# Patient Record
Sex: Male | Born: 1985 | Hispanic: Yes | Marital: Single | Smoking: Current every day smoker
Health system: Southern US, Community
[De-identification: ages and names within clinical notes are randomized; demographics above are authoritative.]

## PROBLEM LIST (undated history)

## (undated) DIAGNOSIS — K449 Diaphragmatic hernia without obstruction or gangrene: Secondary | ICD-10-CM

## (undated) DIAGNOSIS — IMO0002 Reserved for concepts with insufficient information to code with codable children: Secondary | ICD-10-CM

## (undated) DIAGNOSIS — K2970 Gastritis, unspecified, without bleeding: Secondary | ICD-10-CM

## (undated) DIAGNOSIS — G8929 Other chronic pain: Secondary | ICD-10-CM

## (undated) DIAGNOSIS — M549 Dorsalgia, unspecified: Secondary | ICD-10-CM

## (undated) HISTORY — PX: HERNIA REPAIR: SHX51

## (undated) SURGERY — COLONOSCOPY WITH PROPOFOL
Anesthesia: Monitor Anesthesia Care

---

## 2013-01-08 ENCOUNTER — Encounter (HOSPITAL_COMMUNITY): Payer: Self-pay | Admitting: *Deleted

## 2013-01-08 ENCOUNTER — Emergency Department (HOSPITAL_COMMUNITY)
Admission: EM | Admit: 2013-01-08 | Discharge: 2013-01-08 | Disposition: A | Discharge status: Home / Self Care | Payer: Self-pay | Attending: Emergency Medicine | Admitting: Emergency Medicine

## 2013-01-08 DIAGNOSIS — M79641 Pain in right hand: Secondary | ICD-10-CM

## 2013-01-08 DIAGNOSIS — G8911 Acute pain due to trauma: Secondary | ICD-10-CM | POA: Insufficient documentation

## 2013-01-08 DIAGNOSIS — K0889 Other specified disorders of teeth and supporting structures: Secondary | ICD-10-CM

## 2013-01-08 DIAGNOSIS — G8929 Other chronic pain: Secondary | ICD-10-CM | POA: Insufficient documentation

## 2013-01-08 DIAGNOSIS — K089 Disorder of teeth and supporting structures, unspecified: Secondary | ICD-10-CM | POA: Insufficient documentation

## 2013-01-08 DIAGNOSIS — IMO0001 Reserved for inherently not codable concepts without codable children: Secondary | ICD-10-CM | POA: Insufficient documentation

## 2013-01-08 DIAGNOSIS — F17200 Nicotine dependence, unspecified, uncomplicated: Secondary | ICD-10-CM | POA: Insufficient documentation

## 2013-01-08 DIAGNOSIS — Z872 Personal history of diseases of the skin and subcutaneous tissue: Secondary | ICD-10-CM | POA: Insufficient documentation

## 2013-01-08 DIAGNOSIS — Z79899 Other long term (current) drug therapy: Secondary | ICD-10-CM | POA: Insufficient documentation

## 2013-01-08 DIAGNOSIS — Z8719 Personal history of other diseases of the digestive system: Secondary | ICD-10-CM | POA: Insufficient documentation

## 2013-01-08 DIAGNOSIS — IMO0002 Reserved for concepts with insufficient information to code with codable children: Secondary | ICD-10-CM | POA: Insufficient documentation

## 2013-01-08 DIAGNOSIS — M25539 Pain in unspecified wrist: Secondary | ICD-10-CM | POA: Insufficient documentation

## 2013-01-08 HISTORY — DX: Reserved for concepts with insufficient information to code with codable children: IMO0002

## 2013-01-08 HISTORY — DX: Dorsalgia, unspecified: M54.9

## 2013-01-08 HISTORY — DX: Diaphragmatic hernia without obstruction or gangrene: K44.9

## 2013-01-08 HISTORY — DX: Other chronic pain: G89.29

## 2013-01-08 MED ORDER — OMEPRAZOLE 40 MG PO CPDR: 40.0000 mg | DELAYED_RELEASE_CAPSULE | Freq: Two times a day (BID) | ORAL | Status: DC

## 2013-01-08 MED ORDER — PENICILLIN V POTASSIUM 500 MG PO TABS: 500.0000 mg | ORAL_TABLET | Freq: Three times a day (TID) | ORAL | Status: DC

## 2013-01-08 MED ORDER — HYDROCODONE-ACETAMINOPHEN 5-325 MG PO TABS
1.0000 | ORAL_TABLET | Freq: Once | ORAL | Status: AC
  Administered 2013-01-08: 1 via ORAL
  Filled 2013-01-08: qty 1

## 2013-01-08 MED ORDER — HYDROCODONE-ACETAMINOPHEN 5-325 MG PO TABS: 2.0000 | ORAL_TABLET | ORAL | Status: DC | PRN

## 2013-01-08 MED ORDER — PENICILLIN V POTASSIUM 250 MG PO TABS
500.0000 mg | ORAL_TABLET | Freq: Once | ORAL | Status: AC
  Administered 2013-01-08: 500 mg via ORAL
  Filled 2013-01-08: qty 2

## 2013-01-08 NOTE — ED Notes (Signed)
Case Manager at bedside
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
## 2013-01-08 NOTE — ED Notes (Signed)
Patient requested to speak with case manger patient recently moved from [place] and needs helps with medicaid.

## 2013-01-08 NOTE — Progress Notes (Signed)
CSW received call from rn cm. CSW and rn cm discussed pt case and directed patient to follow up with [facility] DSS for [facility] and pt case worker in [place].   Catha Gosselin, LCSWA weekend covering CSW  [phone] .01/08/2013 1528pm

## 2013-01-08 NOTE — Progress Notes (Signed)
Met with patient and brother at bedside. Case manager role-support services and resources discussed with patient.Patient verbalizes his understanding of verbal education and  Written resources on the [facility] and the [employer] and contact number.Patient reports he does not have Medicaid in [place] since moving   From [place].Case manager discussed patient questions, plan with the clinical social worker. Patient aware he needs to follow up with DSS -concerning his request for medicaid.  Patient educated that he can communicate with his medicaid case Production designer, theatre/television/film in [place]. Patient has no further case management needs at this time.

## 2013-01-09 NOTE — ED Provider Notes (Signed)
Medical screening examination/treatment/procedure(s) were performed by non-physician practitioner and as supervising physician I was immediately available for consultation/collaboration.   Krisi Azua E Ashni Lonzo, MD 01/09/13 1443 

## 2013-01-10 ENCOUNTER — Inpatient Hospital Stay (HOSPITAL_COMMUNITY)
Admission: EM | Admit: 2013-01-10 | Discharge: 2013-01-13 | DRG: 392 | Disposition: A | Discharge status: Home / Self Care | Payer: Medicaid - Out of State | Attending: Internal Medicine | Admitting: Internal Medicine

## 2013-01-10 ENCOUNTER — Emergency Department (HOSPITAL_COMMUNITY): Payer: Medicaid - Out of State

## 2013-01-10 ENCOUNTER — Encounter (HOSPITAL_COMMUNITY): Payer: Self-pay

## 2013-01-10 DIAGNOSIS — K5900 Constipation, unspecified: Principal | ICD-10-CM | POA: Diagnosis present

## 2013-01-10 DIAGNOSIS — R112 Nausea with vomiting, unspecified: Secondary | ICD-10-CM

## 2013-01-10 DIAGNOSIS — R1013 Epigastric pain: Secondary | ICD-10-CM

## 2013-01-10 DIAGNOSIS — M79609 Pain in unspecified limb: Secondary | ICD-10-CM | POA: Diagnosis present

## 2013-01-10 DIAGNOSIS — F1210 Cannabis abuse, uncomplicated: Secondary | ICD-10-CM | POA: Diagnosis present

## 2013-01-10 DIAGNOSIS — Z8711 Personal history of peptic ulcer disease: Secondary | ICD-10-CM

## 2013-01-10 DIAGNOSIS — K2210 Ulcer of esophagus without bleeding: Secondary | ICD-10-CM

## 2013-01-10 DIAGNOSIS — K2990 Gastroduodenitis, unspecified, without bleeding: Secondary | ICD-10-CM

## 2013-01-10 DIAGNOSIS — K449 Diaphragmatic hernia without obstruction or gangrene: Secondary | ICD-10-CM | POA: Diagnosis present

## 2013-01-10 DIAGNOSIS — F319 Bipolar disorder, unspecified: Secondary | ICD-10-CM | POA: Diagnosis present

## 2013-01-10 DIAGNOSIS — R109 Unspecified abdominal pain: Secondary | ICD-10-CM | POA: Diagnosis present

## 2013-01-10 DIAGNOSIS — G8929 Other chronic pain: Secondary | ICD-10-CM | POA: Diagnosis present

## 2013-01-10 DIAGNOSIS — K208 Other esophagitis without bleeding: Secondary | ICD-10-CM | POA: Diagnosis present

## 2013-01-10 DIAGNOSIS — F909 Attention-deficit hyperactivity disorder, unspecified type: Secondary | ICD-10-CM | POA: Diagnosis present

## 2013-01-10 DIAGNOSIS — K219 Gastro-esophageal reflux disease without esophagitis: Secondary | ICD-10-CM | POA: Diagnosis present

## 2013-01-10 DIAGNOSIS — Z79899 Other long term (current) drug therapy: Secondary | ICD-10-CM

## 2013-01-10 DIAGNOSIS — K2970 Gastritis, unspecified, without bleeding: Secondary | ICD-10-CM | POA: Diagnosis present

## 2013-01-10 DIAGNOSIS — F17200 Nicotine dependence, unspecified, uncomplicated: Secondary | ICD-10-CM | POA: Diagnosis present

## 2013-01-10 DIAGNOSIS — M549 Dorsalgia, unspecified: Secondary | ICD-10-CM | POA: Diagnosis present

## 2013-01-10 DIAGNOSIS — K920 Hematemesis: Secondary | ICD-10-CM | POA: Diagnosis present

## 2013-01-10 HISTORY — DX: Gastritis, unspecified, without bleeding: K29.70

## 2013-01-10 LAB — COMPREHENSIVE METABOLIC PANEL
Alkaline Phosphatase: 42 U/L (ref 39–117)
BUN: 7 mg/dL (ref 6–23)
Creatinine, Ser: 0.8 mg/dL (ref 0.50–1.35)
GFR calc Af Amer: >90 mL/min (ref >90)
Glucose, Bld: 116 mg/dL — ABNORMAL HIGH (ref 70–99)
Potassium: 3.1 mEq/L — ABNORMAL LOW (ref 3.5–5.1)
Total Bilirubin: 0.8 mg/dL (ref 0.3–1.2)
Total Protein: 6.9 g/dL (ref 6.0–8.3)

## 2013-01-10 LAB — URINALYSIS, ROUTINE W REFLEX MICROSCOPIC
Bilirubin Urine: NEGATIVE
Ketones, ur: 40 mg/dL — AB
Leukocytes, UA: NEGATIVE
Nitrite: NEGATIVE
Specific Gravity, Urine: 1.015 (ref 1.005–1.030)
Urobilinogen, UA: 0.2 mg/dL (ref 0.0–1.0)

## 2013-01-10 LAB — CBC WITH DIFFERENTIAL/PLATELET
Basophils Relative: 0 % (ref 0–1)
Eosinophils Absolute: 0.1 10*3/uL (ref 0.0–0.7)
Eosinophils Relative: 1 % (ref 0–5)
HCT: 39.5 % (ref 39.0–52.0)
Hemoglobin: 13.4 g/dL (ref 13.0–17.0)
Lymphs Abs: 1.6 10*3/uL (ref 0.7–4.0)
MCH: 27.2 pg (ref 26.0–34.0)
MCHC: 33.9 g/dL (ref 30.0–36.0)
MCV: 80.1 fL (ref 78.0–100.0)
Monocytes Absolute: 0.3 10*3/uL (ref 0.1–1.0)
Monocytes Relative: 3 % (ref 3–12)
Neutrophils Relative %: 82 % — ABNORMAL HIGH (ref 43–77)
RBC: 4.93 MIL/uL (ref 4.22–5.81)

## 2013-01-10 LAB — RAPID URINE DRUG SCREEN, HOSP PERFORMED
Barbiturates: NOT DETECTED
Tetrahydrocannabinol: POSITIVE — AB

## 2013-01-10 LAB — LIPASE, BLOOD: Lipase: 19 U/L (ref 11–59)

## 2013-01-10 MED ORDER — GI COCKTAIL ~~LOC~~
30.0000 mL | Freq: Once | ORAL | Status: AC
  Administered 2013-01-10: 30 mL via ORAL
  Filled 2013-01-10: qty 30

## 2013-01-10 MED ORDER — ONDANSETRON HCL 4 MG PO TABS: 4.0000 mg | ORAL_TABLET | Freq: Four times a day (QID) | ORAL | Status: DC | PRN

## 2013-01-10 MED ORDER — HYDROMORPHONE HCL PF 1 MG/ML IJ SOLN
1.0000 mg | Freq: Once | INTRAMUSCULAR | Status: AC
  Administered 2013-01-10: 1 mg via INTRAVENOUS
  Filled 2013-01-10: qty 1

## 2013-01-10 MED ORDER — DOCUSATE SODIUM 100 MG PO CAPS
100.0000 mg | ORAL_CAPSULE | Freq: Two times a day (BID) | ORAL | Status: DC
  Administered 2013-01-12: 100 mg via ORAL
  Filled 2013-01-10 (×3): qty 1

## 2013-01-10 MED ORDER — ONDANSETRON HCL 4 MG PO TABS: 4.0000 mg | ORAL_TABLET | Freq: Four times a day (QID) | ORAL | Status: DC

## 2013-01-10 MED ORDER — ONDANSETRON HCL 4 MG/2ML IJ SOLN
4.0000 mg | Freq: Once | INTRAMUSCULAR | Status: AC
  Administered 2013-01-10: 4 mg via INTRAVENOUS
  Filled 2013-01-10: qty 2

## 2013-01-10 MED ORDER — AMITRIPTYLINE HCL 10 MG PO TABS
10.0000 mg | ORAL_TABLET | Freq: Every day | ORAL | Status: DC
  Administered 2013-01-12: 10 mg via ORAL
  Filled 2013-01-10 (×4): qty 1

## 2013-01-10 MED ORDER — CLONAZEPAM 1 MG PO TABS
2.0000 mg | ORAL_TABLET | Freq: Every day | ORAL | Status: DC
  Administered 2013-01-10: 2 mg via ORAL
  Filled 2013-01-10: qty 4
  Filled 2013-01-10: qty 1

## 2013-01-10 MED ORDER — GABAPENTIN 600 MG PO TABS
600.0000 mg | ORAL_TABLET | Freq: Three times a day (TID) | ORAL | Status: DC
  Administered 2013-01-12: 600 mg via ORAL
  Filled 2013-01-10 (×10): qty 1

## 2013-01-10 MED ORDER — ONDANSETRON HCL 4 MG/2ML IJ SOLN
4.0000 mg | Freq: Four times a day (QID) | INTRAMUSCULAR | Status: DC | PRN
  Administered 2013-01-10 – 2013-01-11 (×2): 4 mg via INTRAVENOUS
  Filled 2013-01-10: qty 2

## 2013-01-10 MED ORDER — PANTOPRAZOLE SODIUM 40 MG PO TBEC
40.0000 mg | DELAYED_RELEASE_TABLET | Freq: Every day | ORAL | Status: DC
  Administered 2013-01-10: 40 mg via ORAL
  Filled 2013-01-10: qty 1

## 2013-01-10 MED ORDER — SUCRALFATE 1 G PO TABS
1.0000 g | ORAL_TABLET | Freq: Three times a day (TID) | ORAL | Status: DC
  Filled 2013-01-10 (×11): qty 1

## 2013-01-10 MED ORDER — OXYCODONE-ACETAMINOPHEN 5-325 MG PO TABS
1.0000 | ORAL_TABLET | Freq: Once | ORAL | Status: DC
  Filled 2013-01-10: qty 1

## 2013-01-10 MED ORDER — ONDANSETRON HCL 4 MG/2ML IJ SOLN
4.0000 mg | Freq: Three times a day (TID) | INTRAMUSCULAR | Status: DC | PRN
  Filled 2013-01-10: qty 2

## 2013-01-10 MED ORDER — KETOROLAC TROMETHAMINE 15 MG/ML IJ SOLN
15.0000 mg | Freq: Four times a day (QID) | INTRAMUSCULAR | Status: DC | PRN
  Administered 2013-01-11: 15 mg via INTRAVENOUS
  Filled 2013-01-10: qty 1

## 2013-01-10 MED ORDER — PANTOPRAZOLE SODIUM 40 MG IV SOLR
40.0000 mg | Freq: Once | INTRAVENOUS | Status: AC
  Administered 2013-01-10: 40 mg via INTRAVENOUS
  Filled 2013-01-10: qty 40

## 2013-01-10 MED ORDER — METOCLOPRAMIDE HCL 5 MG/ML IJ SOLN
10.0000 mg | Freq: Once | INTRAMUSCULAR | Status: AC
  Administered 2013-01-10: 10 mg via INTRAVENOUS
  Filled 2013-01-10: qty 2

## 2013-01-10 MED ORDER — DICYCLOMINE HCL 20 MG PO TABS
20.0000 mg | ORAL_TABLET | Freq: Four times a day (QID) | ORAL | Status: DC
  Administered 2013-01-10: 20 mg via ORAL
  Filled 2013-01-10 (×8): qty 1

## 2013-01-10 MED ORDER — ENOXAPARIN SODIUM 40 MG/0.4ML ~~LOC~~ SOLN
40.0000 mg | SUBCUTANEOUS | Status: DC
  Filled 2013-01-10 (×2): qty 0.4

## 2013-01-10 MED ORDER — SODIUM CHLORIDE 0.9 % IV SOLN
INTRAVENOUS | Status: DC
  Administered 2013-01-11 – 2013-01-12 (×2): via INTRAVENOUS

## 2013-01-10 MED ORDER — ALUM & MAG HYDROXIDE-SIMETH 200-200-20 MG/5ML PO SUSP: 30.0000 mL | Freq: Four times a day (QID) | ORAL | Status: DC | PRN

## 2013-01-10 MED ORDER — POTASSIUM CHLORIDE CRYS ER 20 MEQ PO TBCR
40.0000 meq | EXTENDED_RELEASE_TABLET | Freq: Once | ORAL | Status: AC
  Administered 2013-01-10: 40 meq via ORAL
  Filled 2013-01-10: qty 2

## 2013-01-10 MED ORDER — SODIUM CHLORIDE 0.9 % IV BOLUS (SEPSIS)
1000.0000 mL | Freq: Once | INTRAVENOUS | Status: AC
  Administered 2013-01-10: 1000 mL via INTRAVENOUS

## 2013-01-10 MED ORDER — IOHEXOL 300 MG/ML  SOLN
100.0000 mL | Freq: Once | INTRAMUSCULAR | Status: AC | PRN
  Administered 2013-01-10: 100 mL via INTRAVENOUS

## 2013-01-10 MED ORDER — PEG 3350-KCL-NA BICARB-NACL 420 G PO SOLR
4000.0000 mL | Freq: Once | ORAL | Status: AC
  Administered 2013-01-11: 4000 mL via ORAL
  Filled 2013-01-10: qty 4000

## 2013-01-10 MED ORDER — PROMETHAZINE HCL 25 MG/ML IJ SOLN
12.5000 mg | INTRAMUSCULAR | Status: AC
  Administered 2013-01-10: 12.5 mg via INTRAVENOUS
  Filled 2013-01-10: qty 1

## 2013-01-10 MED ORDER — LUBIPROSTONE 24 MCG PO CAPS
24.0000 ug | ORAL_CAPSULE | Freq: Two times a day (BID) | ORAL | Status: DC
  Administered 2013-01-10: 24 ug via ORAL
  Filled 2013-01-10 (×9): qty 1
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
## 2013-01-10 NOTE — ED Notes (Signed)
Friend of pt back up to desk stating now that the pt would wants to leave since we are not giving strong pain meds to him.  Informed [place], PA regarding this and he is in to speak with pt.

## 2013-01-10 NOTE — ED Provider Notes (Signed)
Medical screening examination/treatment/procedure(s) were conducted as a shared visit with non-physician practitioner(s) and myself.  I personally evaluated the patient during the encounter  Patient with persistent abdominal pain with bilious vomiting. Workup here nonfocal at this time. [facility] consult tri[facility] hospitalist  Toy Baker, MD 01/10/13 1527
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
## 2013-01-10 NOTE — ED Provider Notes (Signed)
6:37 PM CT reviewed. Pt is constipated. ? narcotics induced. Also signs of gastritis, ?2/2 vomiting. Spoke with hospitalist who will see and admit.   Patient stable. Continues to request nausea medication.   Exam:  Gen NAD; Heart RRR, nml S1,S2, no m/r/g; Lungs CTAB; Abd soft, upper abd/epigastric tenderness, no rebound or guarding; Ext 2+ pedal pulses bilaterally, no edema.    Renne Crigler, PA-C 01/10/13 1840

## 2013-01-10 NOTE — ED Notes (Signed)
Friend of pt up at desk asking about stronger pain meds.  Informed pt and friend that he was given medication and once labs were back the PA would reassess.
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
## 2013-01-10 NOTE — ED Provider Notes (Signed)
History    CSN: [id number] Arrival date & time 01/10/13  0602  First MD Initiated Contact with Patient 01/10/13 0604     No chief complaint on file.  (Consider location/radiation/quality/duration/timing/severity/associated sxs/prior Treatment) HPI  27 year old male presents complaining of abdominal pain. Patient reports for the past 2 days he has had pain to his epigastric region. Pain is sharp, radiates to back.  Complaining of being nauseous and has been vomiting.  Vomitus is bilious, non bloody.  Sxs worse with eating.  Has been unable to tolerates any PO.  Eating makes it worse, nothing makes it better.  Admits to drinking 1 beer this AM.  Denies fever, chills, cp, sob, dysuria, hematuria, or rash.  Has had similar sxs in the past.  Has been evaluated for this with CT scan and endoscopy while living in [facility], no specific finding.  Was treated for H.pylori in the past.  Currently taking multiple acid blocker meds.  LBM this AM, normal  Past Medical History  Diagnosis Date  . Hiatal hernia   . Ulcer   . Back pain, chronic    No past surgical history on file. No family history on file. History  Substance Use Topics  . Smoking status: Current Every Day Smoker  . Smokeless tobacco: Not on file  . Alcohol Use: No    Review of Systems  All other systems reviewed and are negative.    Allergies  Other  Home Medications   Current Outpatient Rx  Name  Route  Sig  Dispense  Refill  . amitriptyline (ELAVIL) 75 MG tablet   Oral   Take by mouth at bedtime.         . clonazePAM (KLONOPIN) 2 MG tablet   Oral   Take 2 mg by mouth daily.         Marland Kitchen dicyclomine (BENTYL) 20 MG tablet   Oral   Take 20 mg by mouth every 6 (six) hours.         Marland Kitchen etodolac (LODINE) 400 MG tablet   Oral   Take 400 mg by mouth 2 (two) times daily.         Marland Kitchen gabapentin (NEURONTIN) 600 MG tablet   Oral   Take 600 mg by mouth 3 (three) times daily.         Marland Kitchen HYDROcodone-acetaminophen  (NORCO/VICODIN) 5-325 MG per tablet   Oral   Take 2 tablets by mouth every 4 (four) hours as needed for pain.   10 tablet   0   . hydrocortisone (ANUSOL-HC) 2.5 % rectal cream   Rectal   Place rectally 2 (two) times daily.         . lansoprazole (PREVACID) 30 MG capsule   Oral   Take 30 mg by mouth daily.         . metoCLOPramide (REGLAN) 10 MG tablet   Oral   Take 10 mg by mouth 4 (four) times daily.         . naproxen (NAPROSYN) 500 MG tablet   Oral   Take 500 mg by mouth 2 (two) times daily with a meal.         . omeprazole (PRILOSEC) 40 MG capsule   Oral   Take 1 capsule (40 mg total) by mouth 2 (two) times daily.   30 capsule   0   . penicillin v potassium (VEETID) 500 MG tablet   Oral   Take 1 tablet (500 mg total) by mouth 3 (three)  times daily.   30 tablet   0   . potassium chloride (K-DUR,KLOR-CON) 10 MEQ tablet   Oral   Take 10 mEq by mouth 2 (two) times daily.         . promethazine (PHENERGAN) 25 MG tablet   Oral   Take 25 mg by mouth every 6 (six) hours as needed for nausea.          There were no vitals taken for this visit. Physical Exam  Nursing note and vitals reviewed. Constitutional: He appears well-developed and well-nourished. He appears distressed (uncomfortable appearing, writhing in bed).  HENT:  Head: Normocephalic and atraumatic.  Mouth/Throat: Oropharynx is clear and moist.  Eyes: Conjunctivae are normal.  Neck: Neck supple.  Cardiovascular: Normal rate and regular rhythm.   Pulmonary/Chest: Effort normal and breath sounds normal. He has no wheezes.  Abdominal: Soft. He exhibits no distension. There is tenderness (tenderness to epigastric region without guarding or rebound.  No hernia noted.  no surgical scars).  Neurological: He is alert.  Skin: No rash noted.  Psychiatric: He has a normal mood and affect.    ED Course  Procedures (including critical care time)   Date: 01/10/2013  Rate: 79  Rhythm: normal sinus  rhythm  QRS Axis: normal  Intervals: normal  ST/T Wave abnormalities: normal  Conduction Disutrbances: none  Narrative Interpretation:   Old EKG Reviewed: none for comparison  6:24 AM Pt seen for epigastric abd pain.  Hx of PUD, denies prior hx of pancreatitis or biliary disease.  Admits to drinking 1 beer this AM.  Initial exam was difficult as pt appears to be in pain.  However, is afebrile, VSS.  Work up initiated.  Will give IVF, antiemetic, GI cocktail, PPI and will continue to monitor.    7:36 AM Pt is persistent in demanding narcotic pain medication.  I offer to treat his nausea and will check lab work.  DDx: pancreatitis, biliary disease, PUD, gastritis, colitis, atypical CP.  Pt brought a folder of his prior medical records from [facility], it appears that pt has chronic abd pain of unknown etiology and has mult work up in the past including CT scan and endoscopy.  Remote hx of H.Pylori infection.  Care discussed with attending.   9:33 AM Patient does have mild elevated ketone urine, a potassium of 3.1 but otherwise labs are reassuring. We'll continue IV fluid, antiemetic, pain medication, and will also obtain abdominal ultrasound to rule out biliary disease.  2:50 PM abd US shows no acute finding.  Unable to visualized pancreas however pt has normal lipase, making pancreatitis less likely.  However pt request admission, has been vomiting bilious content.  Unable to tolerates PO.  My attending and I have evaluate pt, we will consider having pt admit for further management.    3:52 PM i have consulted with [facility], Dr. Deno Etienne, who request abd CT scan for further evaluation to r/o any surgical related component that would need surgical intervention.  Care plan shared with oncoming PA who will continue management and will dispo as appropriate.    Labs Reviewed  CBC WITH DIFFERENTIAL - Abnormal; Notable for the following:    WBC 10.9 (*)    Neutrophils Relative % 82 (*)    Neutro Abs  9.0 (*)    All other components within normal limits  COMPREHENSIVE METABOLIC PANEL - Abnormal; Notable for the following:    Potassium 3.1 (*)    Glucose, Bld 116 (*)  All other components within normal limits  URINALYSIS, ROUTINE W REFLEX MICROSCOPIC - Abnormal; Notable for the following:    Ketones, ur 40 (*)    All other components within normal limits  URINE RAPID DRUG SCREEN (HOSP PERFORMED) - Abnormal; Notable for the following:    Tetrahydrocannabinol POSITIVE (*)    All other components within normal limits  LIPASE, BLOOD  ETHANOL   US Abdomen Complete  01/10/2013   *RADIOLOGY REPORT*  Clinical Data:   Abdominal pain  COMPLETE ABDOMINAL ULTRASOUND  Comparison:  None.  Findings:  Gallbladder:  No gallstones, gallbladder wall thickening, or pericholecystic fluid.  Common bile duct:  Measures 3 mm in diameter, within normal limits.  Liver:  No focal lesion identified.  Within normal limits in parenchymal echogenicity.  IVC:  Appears normal.  Pancreas:  Not seen due to overlying bowel gas.  Spleen:  Measures 6.4 cm craniocaudad and appears normal.  Right Kidney:  Measures 11.9 cm in length and appears normal.  Left Kidney:  Measures 11.7 cm in length and appears normal.  Abdominal aorta:  No aneurysm identified.  IMPRESSION: 1.  Normal sonographic appearance of the upper abdomen.  Please note, however, that the pancreas cannot be visualized due to overlying bowel gas.   Original Report Authenticated By: Gaylyn Rong, M.D.   1. Abdominal pain, epigastric   2. Nausea & vomiting     MDM  BP 136/88  Pulse 64  Temp(Src) 98.5 F (36.9 C) (Oral)  Resp 19  SpO2 100%  I have reviewed nursing notes and vital signs. I personally reviewed the imaging tests through PACS system  I reviewed available ER/hospitalization records thought the EMR   Fayrene Helper, PA-C 01/10/13 1501  Fayrene Helper, PA-C 01/10/13 1601

## 2013-01-10 NOTE — ED Notes (Signed)
Pt to ed c/o abd pain starting 2 days ago.  Pt sts was vomiting prior to pain starting.  Has not seen his pcp for this. sts has hx of same.  Friend at bedside giving hx.

## 2013-01-10 NOTE — ED Notes (Signed)
Patient actively vomiting

## 2013-01-11 DIAGNOSIS — R1013 Epigastric pain: Secondary | ICD-10-CM

## 2013-01-11 DIAGNOSIS — G8929 Other chronic pain: Secondary | ICD-10-CM

## 2013-01-11 DIAGNOSIS — R112 Nausea with vomiting, unspecified: Secondary | ICD-10-CM

## 2013-01-11 DIAGNOSIS — K920 Hematemesis: Secondary | ICD-10-CM | POA: Diagnosis present

## 2013-01-11 LAB — CBC
HCT: 37.6 % — ABNORMAL LOW (ref 39.0–52.0)
MCV: 80.7 fL (ref 78.0–100.0)
RBC: 4.66 MIL/uL (ref 4.22–5.81)
WBC: 10.7 10*3/uL — ABNORMAL HIGH (ref 4.0–10.5)

## 2013-01-11 LAB — COMPREHENSIVE METABOLIC PANEL
Albumin: 4 g/dL (ref 3.5–5.2)
Alkaline Phosphatase: 48 U/L (ref 39–117)
BUN: 10 mg/dL (ref 6–23)
Potassium: 3.3 mEq/L — ABNORMAL LOW (ref 3.5–5.1)
Total Protein: 6.5 g/dL (ref 6.0–8.3)

## 2013-01-11 MED ORDER — ZOLPIDEM TARTRATE 5 MG PO TABS
5.0000 mg | ORAL_TABLET | Freq: Once | ORAL | Status: AC
  Administered 2013-01-12: 5 mg via ORAL
  Filled 2013-01-11: qty 1

## 2013-01-11 MED ORDER — PANTOPRAZOLE SODIUM 40 MG IV SOLR
40.0000 mg | Freq: Two times a day (BID) | INTRAVENOUS | Status: DC
  Administered 2013-01-11 – 2013-01-12 (×3): 40 mg via INTRAVENOUS
  Filled 2013-01-11 (×6): qty 40

## 2013-01-11 MED ORDER — ONDANSETRON HCL 4 MG PO TABS
4.0000 mg | ORAL_TABLET | Freq: Four times a day (QID) | ORAL | Status: AC
  Filled 2013-01-11 (×2): qty 1

## 2013-01-11 MED ORDER — ONDANSETRON HCL 4 MG/2ML IJ SOLN
4.0000 mg | Freq: Three times a day (TID) | INTRAMUSCULAR | Status: AC | PRN
  Administered 2013-01-11: 4 mg via INTRAVENOUS
  Filled 2013-01-11: qty 2

## 2013-01-11 MED ORDER — HYDROMORPHONE HCL PF 1 MG/ML IJ SOLN
1.0000 mg | INTRAMUSCULAR | Status: DC | PRN
  Administered 2013-01-11 – 2013-01-12 (×6): 1 mg via INTRAVENOUS
  Filled 2013-01-11 (×7): qty 1

## 2013-01-11 MED ORDER — PROMETHAZINE HCL 25 MG/ML IJ SOLN
12.5000 mg | Freq: Once | INTRAMUSCULAR | Status: AC
  Administered 2013-01-11: 12.5 mg via INTRAVENOUS
  Filled 2013-01-11: qty 1

## 2013-01-11 MED ORDER — ONDANSETRON HCL 4 MG/2ML IJ SOLN
4.0000 mg | Freq: Four times a day (QID) | INTRAMUSCULAR | Status: AC
  Administered 2013-01-11 – 2013-01-12 (×4): 4 mg via INTRAVENOUS
  Filled 2013-01-11 (×4): qty 2

## 2013-01-11 MED ORDER — HYDROCODONE-ACETAMINOPHEN 5-325 MG PO TABS
1.0000 | ORAL_TABLET | ORAL | Status: DC | PRN
  Administered 2013-01-11: 1 via ORAL
  Filled 2013-01-11: qty 1

## 2013-01-11 MED ORDER — HYDROMORPHONE HCL PF 1 MG/ML IJ SOLN
INTRAMUSCULAR | Status: AC
  Administered 2013-01-11: 11:00:00
  Filled 2013-01-11: qty 1

## 2013-01-11 NOTE — ED Provider Notes (Signed)
Medical screening examination/treatment/procedure(s) were performed by non-physician practitioner and as supervising physician I was immediately available for consultation/collaboration.   Brandt Loosen, MD 01/11/13 [phone]

## 2013-01-11 NOTE — Consult Note (Signed)
Pt seen and examined, x-rays reviewed.  Full note to follow.  Pt's abdominal pain is chronic, likely NSAID-related, and related to chronic constipation.  Active PUD should be r/oed.  He has had limited UGI bleeding, perhaps related to esophagitis and/or active PUD.  Recommend 1.  Hold NSAIDS; continue PPI therapy 2.  EGD - will schedule for am 3.  miralax for constipation.  As outpt he may be eligible for a narcotic/constipation trial 4.  Check Fe studies

## 2013-01-11 NOTE — Progress Notes (Signed)
Pt slept most of the night, he stated he took about 3 cups of the Golytely and didn't like the taste. He was still encouraged to take as much as he can once his nausea resolves. Phenergan 12.5 mg IV and Toradol 15 mg IV were given around 5 AM. Will continue to monitor.
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
## 2013-01-11 NOTE — ED Provider Notes (Signed)
Medical screening examination/treatment/procedure(s) were performed by non-physician practitioner and as supervising physician I was immediately available for consultation/collaboration.  Novice Vrba T Jaslynn Thome, MD 01/11/13 1532 
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
## 2013-01-11 NOTE — Progress Notes (Signed)
Pt was offered the Nulytely/Golytely to take as instructed but refused and stated "too nauseous to take it, I could not hold it in." Zofran 4 mg IV was ordered but too early to administer. MD on call paged and notified to request for additional anti-emetics to cover in-between his episodes. Awaiting MD response. Will continue to monitor.

## 2013-01-12 ENCOUNTER — Encounter (HOSPITAL_COMMUNITY)
Admission: EM | Disposition: A | Discharge status: Home / Self Care | Payer: Self-pay | Source: Home / Self Care | Attending: Internal Medicine

## 2013-01-12 ENCOUNTER — Encounter (HOSPITAL_COMMUNITY): Payer: Self-pay

## 2013-01-12 DIAGNOSIS — K920 Hematemesis: Secondary | ICD-10-CM

## 2013-01-12 DIAGNOSIS — K2210 Ulcer of esophagus without bleeding: Secondary | ICD-10-CM

## 2013-01-12 DIAGNOSIS — K449 Diaphragmatic hernia without obstruction or gangrene: Secondary | ICD-10-CM

## 2013-01-12 DIAGNOSIS — K2990 Gastroduodenitis, unspecified, without bleeding: Secondary | ICD-10-CM

## 2013-01-12 DIAGNOSIS — K2970 Gastritis, unspecified, without bleeding: Secondary | ICD-10-CM

## 2013-01-12 DIAGNOSIS — K5900 Constipation, unspecified: Principal | ICD-10-CM

## 2013-01-12 DIAGNOSIS — K208 Other esophagitis without bleeding: Secondary | ICD-10-CM

## 2013-01-12 HISTORY — PX: ESOPHAGOGASTRODUODENOSCOPY: SHX5428

## 2013-01-12 LAB — BASIC METABOLIC PANEL
BUN: 9 mg/dL (ref 6–23)
CO2: 26 mEq/L (ref 19–32)
Chloride: 106 mEq/L (ref 96–112)
Creatinine, Ser: 1.05 mg/dL (ref 0.50–1.35)
GFR calc Af Amer: >90 mL/min (ref >90)

## 2013-01-12 LAB — CBC
HCT: 37.2 % — ABNORMAL LOW (ref 39.0–52.0)
MCV: 82.1 fL (ref 78.0–100.0)
RDW: 13.8 % (ref 11.5–15.5)
WBC: 8.5 10*3/uL (ref 4.0–10.5)

## 2013-01-12 SURGERY — EGD (ESOPHAGOGASTRODUODENOSCOPY)
Anesthesia: Moderate Sedation

## 2013-01-12 MED ORDER — DIPHENHYDRAMINE HCL 50 MG/ML IJ SOLN
INTRAMUSCULAR | Status: AC
  Filled 2013-01-12: qty 1

## 2013-01-12 MED ORDER — MIDAZOLAM HCL 5 MG/5ML IJ SOLN
INTRAMUSCULAR | Status: DC | PRN
  Administered 2013-01-12 (×5): 2 mg via INTRAVENOUS

## 2013-01-12 MED ORDER — OXYCODONE HCL 5 MG PO TABS
5.0000 mg | ORAL_TABLET | Freq: Four times a day (QID) | ORAL | Status: DC | PRN
  Administered 2013-01-12: 5 mg via ORAL
  Filled 2013-01-12: qty 2

## 2013-01-12 MED ORDER — BUTAMBEN-TETRACAINE-BENZOCAINE 2-2-14 % EX AERO
INHALATION_SPRAY | CUTANEOUS | Status: DC | PRN
  Administered 2013-01-12: 1 via TOPICAL

## 2013-01-12 MED ORDER — FENTANYL CITRATE 0.05 MG/ML IJ SOLN
INTRAMUSCULAR | Status: DC | PRN
  Administered 2013-01-12 (×4): 25 ug via INTRAVENOUS

## 2013-01-12 MED ORDER — DIPHENHYDRAMINE HCL 50 MG/ML IJ SOLN
INTRAMUSCULAR | Status: DC | PRN
  Administered 2013-01-12 (×2): 25 mg via INTRAVENOUS

## 2013-01-12 MED ORDER — OXYCODONE HCL 5 MG PO TABS
5.0000 mg | ORAL_TABLET | ORAL | Status: DC | PRN
  Administered 2013-01-12 – 2013-01-13 (×5): 5 mg via ORAL
  Filled 2013-01-12 (×5): qty 1
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
## 2013-01-12 NOTE — Op Note (Signed)
[facility] [address] [place], [address]   ENDOSCOPY PROCEDURE REPORT  PATIENT: Alex Farmer, Alex Farmer  MR#: [id number] BIRTHDATE: 10-12-1985 , 27  yrs. old GENDER: Male ENDOSCOPIST: Louis Meckel, MD REFERRED BY: PROCEDURE DATE:  01/12/2013 PROCEDURE:  EGD w/ biopsy ASA CLASS:     Class II INDICATIONS:  Epigastric pain. MEDICATIONS: These medications were titrated to patient response per physician's verbal order, Versed 10 mg IV, Fentanyl 100 mcg IV, and Benadryl 50 mg IV TOPICAL ANESTHETIC: Cetacaine Spray  DESCRIPTION OF PROCEDURE: After the risks benefits and alternatives of the procedure were thoroughly explained, informed consent was obtained.  The Pentax Gastroscope [id number] endoscope was introduced through the mouth and advanced to the third portion of the duodenum. Without limitations.  The instrument was slowly withdrawn as the mucosa was fully examined.      A large hiatal hernia was present measuring at least 15 cm.  There were erosions in the gastric cardia although there was no fresh or old blood. Biopsies were taken. At the GE junction there were areas of erythema and superficial erosions consistent with erosive esophagitis.   The remainder of the upper endoscopy exam was otherwise normal.  Retroflexed views revealed no abnormalities. The scope was then withdrawn from the patient and the procedure completed.  COMPLICATIONS: There were no complications. ENDOSCOPIC IMPRESSION: 1.   large hiatal hernia 2.  erosive esophagitis and gastritis  RECOMMENDATIONS: 1.  continue PPI therapy 2.  upper GI series REPEAT EXAM:  eSigned:  Louis Meckel, MD 01/12/2013 10:57 AM   CC:

## 2013-01-12 NOTE — Care Management Note (Signed)
  Page 1 of 1   01/12/2013     3:46:28 PM   CARE MANAGEMENT NOTE 01/12/2013  Patient:  Alex Farmer,Alex Farmer   Account Number:  [id number]  Date Initiated:  01/12/2013  Documentation initiated by:  Ronny Flurry  Subjective/Objective Assessment:     Action/Plan:   Anticipated DC Date:     Anticipated DC Plan:  HOME/SELF CARE         Choice offered to / List presented to:             Status of service:   Medicare Important Message given?   (If response is "NO", the following Medicare IM given date fields will be blank) Date Medicare IM given:   Date Additional Medicare IM given:    Discharge Disposition:    Per UR Regulation:    If discussed at Long Length of Stay Meetings, dates discussed:    Comments:  01-12-13 Referral for PCP .  Provided list of Primary Care Resources . Patient wants to follow up with [facility] . NCM called  [facility]  message to call back later . Emailed Fay Records at  [facility]  asking her to schedule patient for follow up and call patient directly with appointment day and time confirmed patients phone number with patient . [place] voiced understanding and also has  [employer] and [employer] contact information.  Ronny Flurry RN BSN [phone]

## 2013-01-12 NOTE — Interval H&P Note (Signed)
History and Physical Interval Note:  01/12/2013 10:32 AM  Alex Farmer  has presented today for surgery, with the diagnosis of upper GI bleed  The various methods of treatment have been discussed with the patient and family. After consideration of risks, benefits and other options for treatment, the patient has consented to  Procedure(s): ESOPHAGOGASTRODUODENOSCOPY (EGD) (N/A) as a surgical intervention .  The patient's history has been reviewed, patient examined, no change in status, stable for surgery.  I have reviewed the patient's chart and labs.  Questions were answered to the patient's satisfaction.     The recent H&P (dated *01/11/13**) was reviewed, the patient was examined and there is no change in the patients condition since that H&P was completed.   Melvia Heaps  01/12/2013, 10:32 AM   Melvia Heaps
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
## 2013-01-12 NOTE — Progress Notes (Signed)
See endoscopy report. Erosive gastritis and esophagitis would account for his limited GI bleeding. He has a very large hiatal hernia which may be the root of his upper GI complaints. Plan upper GI series for further delineation of anatomy.

## 2013-01-13 ENCOUNTER — Inpatient Hospital Stay (HOSPITAL_COMMUNITY): Payer: Medicaid - Out of State

## 2013-01-13 ENCOUNTER — Encounter (HOSPITAL_COMMUNITY): Payer: Self-pay | Admitting: Gastroenterology

## 2013-01-13 LAB — CBC
Hemoglobin: 12.3 g/dL — ABNORMAL LOW (ref 13.0–17.0)
MCH: 27.2 pg (ref 26.0–34.0)
Platelets: 153 10*3/uL (ref 150–400)
RBC: 4.52 MIL/uL (ref 4.22–5.81)
WBC: 8.3 10*3/uL (ref 4.0–10.5)

## 2013-01-13 MED ORDER — PANTOPRAZOLE SODIUM 40 MG PO TBEC: 40.0000 mg | DELAYED_RELEASE_TABLET | Freq: Two times a day (BID) | ORAL | Status: DC

## 2013-01-13 MED ORDER — OXYCODONE HCL 5 MG PO TABS: 5.0000 mg | ORAL_TABLET | ORAL | Status: DC | PRN

## 2013-01-13 MED ORDER — ONDANSETRON HCL 4 MG/2ML IJ SOLN
INTRAMUSCULAR | Status: AC
  Filled 2013-01-13: qty 2

## 2013-01-13 MED ORDER — ONDANSETRON HCL 4 MG/2ML IJ SOLN: 4.0000 mg | Freq: Four times a day (QID) | INTRAMUSCULAR | Status: DC | PRN

## 2013-01-13 NOTE — Progress Notes (Signed)
Pt c/o nausea and states he vomited three times but I  Did not see this called MD order for Zofran 4  mg which was given    Pt no further c/o nausea wanting pain med now  Which was given
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
## 2013-01-17 ENCOUNTER — Encounter (HOSPITAL_COMMUNITY): Payer: Self-pay | Admitting: Nurse Practitioner

## 2013-01-17 ENCOUNTER — Emergency Department (HOSPITAL_COMMUNITY)
Admission: EM | Admit: 2013-01-17 | Discharge: 2013-01-17 | Disposition: A | Discharge status: Home / Self Care | Payer: Medicaid - Out of State | Attending: Emergency Medicine | Admitting: Emergency Medicine

## 2013-01-17 ENCOUNTER — Emergency Department (HOSPITAL_COMMUNITY): Payer: Medicaid - Out of State

## 2013-01-17 DIAGNOSIS — K2970 Gastritis, unspecified, without bleeding: Secondary | ICD-10-CM | POA: Insufficient documentation

## 2013-01-17 DIAGNOSIS — L98499 Non-pressure chronic ulcer of skin of other sites with unspecified severity: Secondary | ICD-10-CM | POA: Insufficient documentation

## 2013-01-17 DIAGNOSIS — G8929 Other chronic pain: Secondary | ICD-10-CM | POA: Insufficient documentation

## 2013-01-17 DIAGNOSIS — K2990 Gastroduodenitis, unspecified, without bleeding: Secondary | ICD-10-CM | POA: Insufficient documentation

## 2013-01-17 DIAGNOSIS — G894 Chronic pain syndrome: Secondary | ICD-10-CM | POA: Insufficient documentation

## 2013-01-17 DIAGNOSIS — F17200 Nicotine dependence, unspecified, uncomplicated: Secondary | ICD-10-CM | POA: Insufficient documentation

## 2013-01-17 DIAGNOSIS — M549 Dorsalgia, unspecified: Secondary | ICD-10-CM | POA: Insufficient documentation

## 2013-01-17 DIAGNOSIS — K449 Diaphragmatic hernia without obstruction or gangrene: Secondary | ICD-10-CM | POA: Insufficient documentation

## 2013-01-17 DIAGNOSIS — Z79899 Other long term (current) drug therapy: Secondary | ICD-10-CM | POA: Insufficient documentation

## 2013-01-17 LAB — CBC WITH DIFFERENTIAL/PLATELET
Eosinophils Relative: 0 % (ref 0–5)
HCT: 40.8 % (ref 39.0–52.0)
Hemoglobin: 13.2 g/dL (ref 13.0–17.0)
Lymphocytes Relative: 14 % (ref 12–46)
Lymphs Abs: 1.9 10*3/uL (ref 0.7–4.0)
MCV: 82.3 fL (ref 78.0–100.0)
Monocytes Absolute: 0.4 10*3/uL (ref 0.1–1.0)
Monocytes Relative: 3 % (ref 3–12)
Neutro Abs: 10.8 10*3/uL — ABNORMAL HIGH (ref 1.7–7.7)
RBC: 4.96 MIL/uL (ref 4.22–5.81)
RDW: 14 % (ref 11.5–15.5)
WBC: 13.1 10*3/uL — ABNORMAL HIGH (ref 4.0–10.5)

## 2013-01-17 LAB — COMPREHENSIVE METABOLIC PANEL
AST: 8 U/L (ref 0–37)
BUN: 13 mg/dL (ref 6–23)
CO2: 26 mEq/L (ref 19–32)
Calcium: 10.1 mg/dL (ref 8.4–10.5)
Chloride: 105 mEq/L (ref 96–112)
Creatinine, Ser: 0.9 mg/dL (ref 0.50–1.35)
GFR calc Af Amer: >90 mL/min (ref >90)
GFR calc non Af Amer: >90 mL/min (ref >90)
Glucose, Bld: 112 mg/dL — ABNORMAL HIGH (ref 70–99)
Total Bilirubin: 0.5 mg/dL (ref 0.3–1.2)

## 2013-01-17 LAB — URINALYSIS, ROUTINE W REFLEX MICROSCOPIC
Glucose, UA: NEGATIVE mg/dL
Ketones, ur: NEGATIVE mg/dL
Leukocytes, UA: NEGATIVE
Protein, ur: NEGATIVE mg/dL
Urobilinogen, UA: 0.2 mg/dL (ref 0.0–1.0)

## 2013-01-17 MED ORDER — ONDANSETRON HCL 4 MG PO TABS: 4.0000 mg | ORAL_TABLET | Freq: Three times a day (TID) | ORAL | Status: DC | PRN

## 2013-01-17 MED ORDER — GI COCKTAIL ~~LOC~~
30.0000 mL | Freq: Once | ORAL | Status: AC
  Administered 2013-01-17: 30 mL via ORAL
  Filled 2013-01-17: qty 30

## 2013-01-17 MED ORDER — HYDROMORPHONE HCL PF 1 MG/ML IJ SOLN
1.0000 mg | Freq: Once | INTRAMUSCULAR | Status: AC
  Administered 2013-01-17: 1 mg via INTRAVENOUS
  Filled 2013-01-17: qty 1

## 2013-01-17 MED ORDER — ONDANSETRON HCL 4 MG/2ML IJ SOLN
4.0000 mg | Freq: Once | INTRAMUSCULAR | Status: AC
  Administered 2013-01-17: 4 mg via INTRAVENOUS

## 2013-01-17 MED ORDER — ONDANSETRON HCL 4 MG/2ML IJ SOLN
4.0000 mg | Freq: Once | INTRAMUSCULAR | Status: AC
  Administered 2013-01-17: 4 mg via INTRAVENOUS
  Filled 2013-01-17: qty 2

## 2013-01-17 MED ORDER — ONDANSETRON HCL 4 MG/2ML IJ SOLN
4.0000 mg | Freq: Once | INTRAMUSCULAR | Status: DC
  Filled 2013-01-17: qty 2

## 2013-01-17 MED ORDER — SODIUM CHLORIDE 0.9 % IV BOLUS (SEPSIS)
1000.0000 mL | Freq: Once | INTRAVENOUS | Status: AC
  Administered 2013-01-17: 1000 mL via INTRAVENOUS

## 2013-01-17 MED ORDER — IOHEXOL 300 MG/ML  SOLN
100.0000 mL | Freq: Once | INTRAMUSCULAR | Status: AC | PRN
  Administered 2013-01-17: 100 mL via INTRAVENOUS

## 2013-01-17 MED ORDER — PANTOPRAZOLE SODIUM 40 MG IV SOLR
40.0000 mg | Freq: Once | INTRAVENOUS | Status: AC
  Administered 2013-01-17: 40 mg via INTRAVENOUS
  Filled 2013-01-17: qty 40

## 2013-01-17 MED ORDER — IOHEXOL 300 MG/ML  SOLN
25.0000 mL | INTRAMUSCULAR | Status: AC
  Administered 2013-01-17: 25 mL via ORAL

## 2013-01-17 MED ORDER — FAMOTIDINE IN NACL 20-0.9 MG/50ML-% IV SOLN
20.0000 mg | Freq: Once | INTRAVENOUS | Status: AC
  Administered 2013-01-17: 20 mg via INTRAVENOUS
  Filled 2013-01-17: qty 50

## 2013-01-17 NOTE — ED Provider Notes (Signed)
History    CSN: [id number] Arrival date & time 01/17/13  1143  First MD Initiated Contact with Patient 01/17/13 1151     Chief Complaint  Patient presents with  . Abdominal Pain   (Consider location/radiation/quality/duration/timing/severity/associated sxs/prior Treatment) HPI Comments: 27 year old male who presents to the hospital for the second time in the last week after he was recently diagnosed with a sliding hiatal hernia that was thought to be causing abdominal pain nausea and vomiting. He states that while he was in the hospital he received an EGD as well as a CT scan and ultrasound, the upper endoscopy confirm that the patient is a large hiatal hernia measuring approximately 15 cm with some erosive esophagitis. He presented to the methadone clinic this morning for his initial treatment as he is in the "induction phase". He had a first dose of 20 mg of methadone this morning. After that he developed significant abdominal pain associated with nausea and vomiting. This was severe, nothing seems to make it better or worse, this is similar to the symptoms that he experienced last week. He denies any coughing shortness of breath swelling, dysuria, hematuria, diarrhea or constipation at this time. I have reviewed the medical record and verified patient's report regarding his workup as an inpatient. Unfortunately at this time there is no complete discharge summary narrative to suggest with the patient's disposition was from the hospital or plan and followup.  Patient is a 27 y.o. male presenting with abdominal pain. The history is provided by the patient and medical records.  Abdominal Pain Associated symptoms include abdominal pain.   Past Medical History  Diagnosis Date  . Hiatal hernia   . Ulcer   . Back pain, chronic   . Gastritis    Past Surgical History  Procedure Laterality Date  . Esophagogastroduodenoscopy N/A 01/12/2013    Procedure: ESOPHAGOGASTRODUODENOSCOPY (EGD);  Surgeon:  Louis Meckel, MD;  Location: [facility] ENDOSCOPY;  Service: Endoscopy;  Laterality: N/A;   No family history on file. History  Substance Use Topics  . Smoking status: Current Every Day Smoker  . Smokeless tobacco: Not on file  . Alcohol Use: No    Review of Systems  Gastrointestinal: Positive for abdominal pain.  All other systems reviewed and are negative.    Allergies  Other  Home Medications   Current Outpatient Rx  Name  Route  Sig  Dispense  Refill  . gabapentin (NEURONTIN) 600 MG tablet   Oral   Take 600 mg by mouth 3 (three) times daily.         Marland Kitchen omeprazole (PRILOSEC) 40 MG capsule   Oral   Take 1 capsule (40 mg total) by mouth 2 (two) times daily.   30 capsule   0   . ondansetron (ZOFRAN) 4 MG tablet   Oral   Take 1 tablet (4 mg total) by mouth every 6 (six) hours.   12 tablet   0   . oxyCODONE (OXY IR/ROXICODONE) 5 MG immediate release tablet   Oral   Take 1 tablet (5 mg total) by mouth every 4 (four) hours as needed.   30 tablet   0    BP 159/95  Pulse 58  Temp(Src) 98.3 F (36.8 C) (Oral)  Resp 16  Ht 6' (1.829 m)  Wt 215 lb (97.523 kg)  BMI 29.15 kg/m2  SpO2 100% Physical Exam  Nursing note and vitals reviewed. Constitutional: He appears well-developed and well-nourished. He appears distressed.  HENT:  Head: Normocephalic and  atraumatic.  Mouth/Throat: Oropharynx is clear and moist. No oropharyngeal exudate.  Eyes: Conjunctivae and EOM are normal. Pupils are equal, round, and reactive to light. Right eye exhibits no discharge. Left eye exhibits no discharge. No scleral icterus.  Neck: Normal range of motion. Neck supple. No JVD present. No thyromegaly present.  Cardiovascular: Normal rate, regular rhythm, normal heart sounds and intact distal pulses.  Exam reveals no gallop and no friction rub.   No murmur heard. Pulmonary/Chest: Effort normal and breath sounds normal. No respiratory distress. He has no wheezes. He has no rales.  Abdominal:  Soft. Bowel sounds are normal. He exhibits no distension and no mass. There is tenderness ( epigastric ttp, no other ttp, no peritoneal signs).  Musculoskeletal: Normal range of motion. He exhibits no edema and no tenderness.  Lymphadenopathy:    He has no cervical adenopathy.  Neurological: He is alert. Coordination normal.  Skin: Skin is warm and dry. No rash noted. No erythema.  Psychiatric: He has a normal mood and affect. His behavior is normal.    ED Course  Procedures (including critical care time) Labs Reviewed  CBC WITH DIFFERENTIAL  COMPREHENSIVE METABOLIC PANEL  LIPASE, BLOOD  URINALYSIS, ROUTINE W REFLEX MICROSCOPIC   No results found. No diagnosis found.  MDM  At this time the pt has recurrent sx similar to what they were before - VS show mild hypertension, no tachcyardia, no fever.  It is possible that his hiatal hernia is causing his sx and less likely to be related to pancreatitis / cholelithiasis or other source given large w/u that was performed in last 10 days.  Will give some symptomatic medications, fluids and antiemetics pending the w/u including CXR, labs.  The pt has ongoing writhing on the bed - he has an elevated WBC count compared with prior.  He has normal UA without hematuria, ketones or infection.  CMP pending.  The pt has enough pain and appear uncomfortable that a CT is needed at this time despite his recent CT which was performed - he states he is in more pain at this time than he was last week.    Change of shift - care signed out to Dr. Bernette Mayers pending CT results.  Vida Roller, MD 01/18/13 [id number]

## 2013-01-17 NOTE — ED Notes (Signed)
Pt has kept ct contrast down. CT called sts will be next patient.

## 2013-01-17 NOTE — ED Provider Notes (Signed)
Care assumed at shift change. CT neg for acute process. Pt with chronic abdominal pain, long standing, recently moved to this area. Has large hiatal hernia found on recent admission. Advised Gen Surg follow up to discuss surgical options. Also has chronic hand pain, was referred to methadone clinic but he was previously being managed though Pain Management Clinic in [place]. Advised that he would had difficulty finding Pain Management here until he is established with PCP and able to pay for those visits. Advised to continue with methadone clinic in the meantime. Asking for Rx for nausea meds today, advised no narcotic Rx from the ED.    Alex Farmer B. Bernette Mayers, MD 01/17/13 1610

## 2013-01-17 NOTE — ED Notes (Signed)
CT called. Pt finished first drink of contrast.

## 2013-01-17 NOTE — ED Notes (Signed)
Per EMS pt from rehab facility (for pain pills. Pt sts threw up methadone) pt started having severe abd pain and vomiting since 5am today. Pt has hiatal hernia was told needs operation but has not had yet. HAS 4mg  zofran

## 2013-01-18 ENCOUNTER — Ambulatory Visit: Payer: Medicaid - Out of State | Attending: Family Medicine | Admitting: Family Medicine

## 2013-01-18 VITALS — BP 125/88 | HR 78 | Temp 97.7°F | Resp 16 | Wt 212.0 lb

## 2013-01-18 DIAGNOSIS — K2210 Ulcer of esophagus without bleeding: Secondary | ICD-10-CM

## 2013-01-18 DIAGNOSIS — K449 Diaphragmatic hernia without obstruction or gangrene: Secondary | ICD-10-CM

## 2013-01-18 DIAGNOSIS — G8929 Other chronic pain: Secondary | ICD-10-CM

## 2013-01-18 DIAGNOSIS — F1210 Cannabis abuse, uncomplicated: Secondary | ICD-10-CM

## 2013-01-18 DIAGNOSIS — K219 Gastro-esophageal reflux disease without esophagitis: Secondary | ICD-10-CM

## 2013-01-18 DIAGNOSIS — M79609 Pain in unspecified limb: Secondary | ICD-10-CM

## 2013-01-18 DIAGNOSIS — F1290 Cannabis use, unspecified, uncomplicated: Secondary | ICD-10-CM

## 2013-01-18 NOTE — Progress Notes (Signed)
Patient here to establish care Has a hernia that needs to have surgery

## 2013-01-18 NOTE — Progress Notes (Signed)
Patient ID: Alex Farmer, male   DOB: [birth year], 27 y.o.   MRN: [id number]  CC: new patient  HPI: Pt says he recently relocated from [place].  He has been on pain meds (oxycodone) from a right hand injury.  He says that he has been going to a pain mgmt clinic in [place].  He did not bring any medical records.  He also says that he has been using marijuana actively.  He says that he was recently discharged from [facility] with diagnosis of gastritis, large hiatal hernia and chronic pain.  He was supposed to follow up with [facility] for general surgery consideration as treatment of his severe hiatal hernia.  GI placed him on BID PPI therapy.  Pt says he has not started taking the PPIs because medicaid is not paying for it.  He is seeking narcotic pain prescription and says that ER doctor told him to come here to get prescription for narcotics.   Allergies  Allergen Reactions  . Other Nausea And Vomiting    Chili powder and paprika   Past Medical History  Diagnosis Date  . Hiatal hernia   . Ulcer   . Back pain, chronic   . Gastritis    Current Outpatient Prescriptions on File Prior to Visit  Medication Sig Dispense Refill  . omeprazole (PRILOSEC) 40 MG capsule Take 40 mg by mouth 2 (two) times daily.      . ondansetron (ZOFRAN) 4 MG tablet Take 1 tablet (4 mg total) by mouth every 8 (eight) hours as needed for nausea.  20 tablet  0  . oxyCODONE (OXY IR/ROXICODONE) 5 MG immediate release tablet Take 5 mg by mouth every 4 (four) hours as needed for pain.       No current facility-administered medications on file prior to visit.   History reviewed. No pertinent family history. History   Social History  . Marital Status: Single    Spouse Name: N/A    Number of Children: N/A  . Years of Education: N/A   Occupational History  . Not on file.   Social History Main Topics  . Smoking status: Current Every Day Smoker  . Smokeless tobacco: Not on file  . Alcohol Use: No  . Drug Use: Yes   Special: Marijuana  . Sexually Active: Not on file   Other Topics Concern  . Not on file   Social History Narrative  . No narrative on file    Review of Systems  Constitutional: Negative for fever, chills, diaphoresis, activity change, appetite change and fatigue.  HENT: Negative for ear pain, nosebleeds, congestion, facial swelling, rhinorrhea, neck pain, neck stiffness and ear discharge.   Eyes: Negative for pain, discharge, redness, itching and visual disturbance.  Respiratory: Negative for cough, choking, chest tightness, shortness of breath, wheezing and stridor.   Cardiovascular: Negative for chest pain, palpitations and leg swelling.  Gastrointestinal: Negative for abdominal distention.  Genitourinary: Negative for dysuria, urgency, frequency, hematuria, flank pain, decreased urine volume, difficulty urinating and dyspareunia.  Musculoskeletal: right hand pain. Negative for back pain, joint swelling, arthralgias and gait problem.  Neurological: Negative for dizziness, tremors, seizures, syncope, facial asymmetry, speech difficulty, weakness, light-headedness, numbness and headaches.  Hematological: Negative for adenopathy. Does not bruise/bleed easily.  Psychiatric/Behavioral: Negative for hallucinations, behavioral problems, confusion, dysphoric mood, decreased concentration and agitation.    Objective:   Filed Vitals:   01/18/13 1411  BP: 125/88  Pulse: 78  Temp: 97.7 F (36.5 C)  Resp: 16  Physical Exam  Constitutional: Appears well-developed and well-nourished. No distress.  HENT: Normocephalic. External right and left ear normal. Oropharynx is clear and moist.  Eyes: Conjunctivae and EOM are normal. PERRLA, no scleral icterus.  Neck: Normal ROM. Neck supple. No JVD. No tracheal deviation. No thyromegaly.  CVS: RRR, S1/S2 +, no murmurs, no gallops, no carotid bruit.  Pulmonary: Effort and breath sounds normal, no stridor, rhonchi, wheezes, rales.  Abdominal:  Soft. BS +,  no distension, tenderness, rebound or guarding.  Musculoskeletal: Right hand pain. Normal range of motion. No edema.  Lymphadenopathy: No lymphadenopathy noted, cervical, inguinal. Neuro: Alert. Normal reflexes, muscle tone coordination. No cranial nerve deficit. Skin: Skin is warm and dry. No rash noted. Not diaphoretic. No erythema. No pallor.  Psychiatric: Normal mood and affect. Behavior, judgment, thought content normal.   Lab Results  Component Value Date   WBC 13.1* 01/17/2013   HGB 13.2 01/17/2013   HCT 40.8 01/17/2013   MCV 82.3 01/17/2013   PLT 174 01/17/2013   Lab Results  Component Value Date   CREATININE 0.90 01/17/2013   BUN 13 01/17/2013   NA 141 01/17/2013   K 3.8 01/17/2013   CL 105 01/17/2013   CO2 26 01/17/2013    No results found for this basename: HGBA1C   Lipid Panel  No results found for this basename: chol, trig, hdl, cholhdl, vldl, ldlcalc       Assessment and plan:   Patient Active Problem List   Diagnosis Date Noted  . Unspecified gastritis and gastroduodenitis without mention of hemorrhage 01/12/2013  . Erosive esophagitis 01/12/2013  . Hiatal hernia 01/12/2013  . Coffee ground emesis 01/11/2013  . Constipation 01/10/2013  . Abdominal pain 01/10/2013  . Chronic pain 01/10/2013   Pt is seeking a narcotic pain prescription but was told that we needed to get his medical records from PCP and pain management so that we can verify what he was taking and the reasons for him being on the medications.  Also because pt has admitted to actively using marijuana I explained to him that it would not be appropriate for him to come here with no records and receive narcotic pain medications when he is actively using recreational drugs. I explained to him that this wellness center did not do chronic pain management but would be happy to refer him to a pain mgmt service. The patient verbalized understanding.       Referral to pain clinic, GI and general  surgery for eval of his severe hiatal hernia  Continue omeprazole 40 mg po BID (pt advised to get prescription and start taking)  Obtain old records  RTC in 3 months for complete physical exam  The patient was given clear instructions to go to ER or return to medical center if symptoms don't improve, worsen or new problems develop.  The patient verbalized understanding.  The patient was told to call to get any lab results if not heard anything in the next week.    Rodney Langton, MD, CDE, FAAFP [facility] [place], [place]

## 2013-01-18 NOTE — Patient Instructions (Addendum)
Diet for Gastroesophageal Reflux Disease, Adult °Reflux (acid reflux) is when acid from your stomach flows up into the esophagus. When acid comes in contact with the esophagus, the acid causes irritation and soreness (inflammation) in the esophagus. When reflux happens often or so severely that it causes damage to the esophagus, it is called gastroesophageal reflux disease (GERD). Nutrition therapy can help ease the discomfort of GERD. °FOODS OR DRINKS TO AVOID OR LIMIT °· Smoking or chewing tobacco. Nicotine is one of the most potent stimulants to acid production in the gastrointestinal tract. °· Caffeinated and decaffeinated coffee and black tea. °· Regular or low-calorie carbonated beverages or energy drinks (caffeine-free carbonated beverages are allowed).   °· Strong spices, such as black pepper, white pepper, red pepper, cayenne, curry powder, and chili powder. °· Peppermint or spearmint. °· Chocolate. °· High-fat foods, including meats and fried foods. Extra added fats including oils, butter, salad dressings, and nuts. Limit these to less than 8 tsp per day. °· Fruits and vegetables if they are not tolerated, such as citrus fruits or tomatoes. °· Alcohol. °· Any food that seems to aggravate your condition. °If you have questions regarding your diet, call your caregiver or a registered dietitian. °OTHER THINGS THAT MAY HELP GERD INCLUDE:  °· Eating your meals slowly, in a relaxed setting. °· Eating 5 to 6 small meals per day instead of 3 large meals. °· Eliminating food for a period of time if it causes distress. °· Not lying down until 3 hours after eating a meal. °· Keeping the head of your bed raised 6 to 9 inches (15 to 23 cm) by using a foam wedge or blocks under the legs of the bed. Lying flat may make symptoms worse. °· Being physically active. Weight loss may be helpful in reducing reflux in overweight or obese adults. °· Wear loose fitting clothing °EXAMPLE MEAL PLAN °This meal plan is approximately  2,000 calories based on [employer]My[employer].gov meal planning guidelines. °Breakfast °· ½ cup cooked oatmeal. °· 1 cup strawberries. °· 1 cup low-fat milk. °· 1 oz almonds. °Snack °· 1 cup cucumber slices. °· 6 oz yogurt (made from low-fat or fat-free milk). °Lunch °· 2 slice whole-wheat bread. °· 2½ oz sliced turkey. °· 2 tsp mayonnaise. °· 1 cup blueberries. °· 1 cup snap peas. °Snack °· 6 whole-wheat crackers. °· 1 oz string cheese. °Dinner °· ½ cup brown rice. °· 1 cup mixed veggies. °· 1 tsp olive oil. °· 3 oz grilled fish. °Document Released: 06/15/2005 Document Revised: 09/07/2011 Document Reviewed: 05/01/2011 °ExitCare® Patient Information ©2014 [employer]. °Hiatal Hernia °A hiatal hernia occurs when a part of the stomach slides above the diaphragm. The diaphragm is the thin muscle separating the belly (abdomen) from the chest. A hiatal hernia can be something you are born with or develop over time. Hiatal hernias may allow stomach acid to flow back into your esophagus, the tube which carries food from your mouth to your stomach. If this acid causes problems it is called GERD (gastro-esophageal reflux disease).  °SYMPTOMS  °Common symptoms of GERD are heartburn (burning in your chest). This is worse when lying down or bending over. It may also cause belching and indigestion. Some of the things which make GERD worse are: °· Increased weight pushes on stomach making acid rise more easily. °· Smoking markedly increases acid production. °· Alcohol decreases lower esophageal sphincter pressure (valve between stomach and esophagus), allowing acid from stomach into esophagus. °· Late evening meals and going to bed   with a full stomach increases pressure. °· Anything that causes an increase in acid production. °· Lower esophageal sphincter incompetence. °DIAGNOSIS  °Hiatal hernia is often diagnosed with x-rays of your stomach and small bowel. This is called an UGI (upper gastrointestinal x-ray). Sometimes a gastroscopic  procedure is done. This is a procedure where your caregiver uses a flexible instrument to look into the stomach and small bowel. °HOME CARE INSTRUCTIONS  °· Try to achieve and maintain an ideal body weight. °· Avoid drinking alcoholic beverages. °· Stop smoking. °· Put the head of your bed on 4 to 6 inch blocks. This will keep your head and esophagus higher than your stomach. If you cannot use blocks, sleep with several pillows under your head and shoulders. °· Over-the-counter medications will decrease acid production. Your caregiver can also prescribe medications for this. Take as directed. °· 1/2 to 1 teaspoon of an antacid taken every hour while awake, with meals and at bedtime, will neutralize acid. °· Do not take aspirin, ibuprofen (Advil® or Motrin®), or other nonsteroidal anti-inflammatory drugs. °· Do not wear tight clothing around your chest or stomach. °· Eat smaller meals and eat more frequently. This keeps your stomach from getting too full. Eat slowly. °· Do not lie down for 2 or 3 hours after eating. Do not eat or drink anything 1 to 2 hours before going to bed. °· Avoid caffeine beverages (colas, coffee, cocoa, tea), fatty foods, citrus fruits and all other foods and drinks that contain acid and that seem to increase the problems. °· Avoid bending over, especially after eating. Also avoid straining during bowel movements or when urinating or lifting things. Anything that increases the pressure in your belly increases the amount of acid that may be pushed up into your esophagus. °SEEK IMMEDIATE MEDICAL CARE IF: °· There is change in location (pain in arms, neck, jaw, teeth or back) of your pain, or the pain is getting worse. °· You also experience nausea, vomiting, sweating (diaphoresis), or shortness of breath. °· You develop continual vomiting, vomit blood or coffee ground material, have bright red blood in your stools, or have black tarry stools. °Some of these symptoms could signal other problems  such as heart disease. °MAKE SURE YOU:  °· Understand these instructions. °· Monitor your condition. °· Contact your caregiver if you are not doing well or are getting worse. °Document Released: 09/05/2003 Document Revised: 09/07/2011 Document Reviewed: 06/15/2005 °ExitCare® Patient Information ©2014 [employer]. ° °

## 2013-01-20 ENCOUNTER — Telehealth: Payer: Self-pay | Admitting: *Deleted

## 2013-01-20 NOTE — Telephone Encounter (Signed)
Dr Arlyce Dice, please advise on f/u for this pt with EGD at [facility] on 01/12/13. You ordered an UGI, but it was cancelled. Thanks.

## 2013-01-23 NOTE — Telephone Encounter (Signed)
No need for f/u OV

## 2013-01-23 NOTE — Telephone Encounter (Signed)
Noted  

## 2013-02-02 ENCOUNTER — Ambulatory Visit (INDEPENDENT_AMBULATORY_CARE_PROVIDER_SITE_OTHER): Payer: Medicaid - Out of State | Admitting: General Surgery

## 2013-02-03 ENCOUNTER — Ambulatory Visit (INDEPENDENT_AMBULATORY_CARE_PROVIDER_SITE_OTHER): Payer: Medicaid - Out of State | Admitting: General Surgery

## 2013-02-08 ENCOUNTER — Encounter (INDEPENDENT_AMBULATORY_CARE_PROVIDER_SITE_OTHER): Payer: Self-pay | Admitting: General Surgery

## 2013-02-22 ENCOUNTER — Encounter: Payer: Self-pay | Admitting: Gastroenterology

## 2013-03-21 ENCOUNTER — Ambulatory Visit: Payer: Medicaid - Out of State | Admitting: Gastroenterology

## 2013-04-19 ENCOUNTER — Telehealth: Payer: Self-pay

## 2013-07-16 ENCOUNTER — Emergency Department (HOSPITAL_COMMUNITY)
Admission: EM | Admit: 2013-07-16 | Discharge: 2013-07-16 | Disposition: A | Discharge status: Home / Self Care | Payer: Self-pay | Attending: Emergency Medicine | Admitting: Emergency Medicine

## 2013-07-16 ENCOUNTER — Encounter (HOSPITAL_COMMUNITY): Payer: Self-pay | Admitting: Emergency Medicine

## 2013-07-16 ENCOUNTER — Emergency Department (HOSPITAL_COMMUNITY): Payer: Self-pay

## 2013-07-16 DIAGNOSIS — M549 Dorsalgia, unspecified: Secondary | ICD-10-CM | POA: Insufficient documentation

## 2013-07-16 DIAGNOSIS — Z872 Personal history of diseases of the skin and subcutaneous tissue: Secondary | ICD-10-CM | POA: Insufficient documentation

## 2013-07-16 DIAGNOSIS — G8929 Other chronic pain: Secondary | ICD-10-CM | POA: Insufficient documentation

## 2013-07-16 DIAGNOSIS — K2990 Gastroduodenitis, unspecified, without bleeding: Principal | ICD-10-CM

## 2013-07-16 DIAGNOSIS — Z79899 Other long term (current) drug therapy: Secondary | ICD-10-CM | POA: Insufficient documentation

## 2013-07-16 DIAGNOSIS — F17200 Nicotine dependence, unspecified, uncomplicated: Secondary | ICD-10-CM | POA: Insufficient documentation

## 2013-07-16 DIAGNOSIS — K2970 Gastritis, unspecified, without bleeding: Secondary | ICD-10-CM | POA: Insufficient documentation

## 2013-07-16 DIAGNOSIS — Z8659 Personal history of other mental and behavioral disorders: Secondary | ICD-10-CM | POA: Insufficient documentation

## 2013-07-16 LAB — COMPREHENSIVE METABOLIC PANEL
ALK PHOS: 67 U/L (ref 39–117)
ALT: 15 U/L (ref 0–53)
AST: 14 U/L (ref 0–37)
Albumin: 4.2 g/dL (ref 3.5–5.2)
BILIRUBIN TOTAL: 0.4 mg/dL (ref 0.3–1.2)
BUN: 16 mg/dL (ref 6–23)
CHLORIDE: 102 meq/L (ref 96–112)
CO2: 28 meq/L (ref 19–32)
Calcium: 9.3 mg/dL (ref 8.4–10.5)
Creatinine, Ser: 0.94 mg/dL (ref 0.50–1.35)
GFR calc non Af Amer: >90 mL/min (ref >90)
GLUCOSE: 134 mg/dL — AB (ref 70–99)
POTASSIUM: 3.7 meq/L (ref 3.7–5.3)
SODIUM: 142 meq/L (ref 137–147)
TOTAL PROTEIN: 7.1 g/dL (ref 6.0–8.3)

## 2013-07-16 LAB — CBC WITH DIFFERENTIAL/PLATELET
Basophils Absolute: 0 10*3/uL (ref 0.0–0.1)
Basophils Relative: 0 % (ref 0–1)
EOS ABS: 0.2 10*3/uL (ref 0.0–0.7)
Eosinophils Relative: 1 % (ref 0–5)
HCT: 39.8 % (ref 39.0–52.0)
HEMOGLOBIN: 13.1 g/dL (ref 13.0–17.0)
LYMPHS ABS: 1.8 10*3/uL (ref 0.7–4.0)
LYMPHS PCT: 11 % — AB (ref 12–46)
MCH: 27.3 pg (ref 26.0–34.0)
MCHC: 32.9 g/dL (ref 30.0–36.0)
MCV: 82.9 fL (ref 78.0–100.0)
Monocytes Absolute: 0.9 10*3/uL (ref 0.1–1.0)
Monocytes Relative: 6 % (ref 3–12)
NEUTROS ABS: 12.6 10*3/uL — AB (ref 1.7–7.7)
NEUTROS PCT: 81 % — AB (ref 43–77)
PLATELETS: 178 10*3/uL (ref 150–400)
RBC: 4.8 MIL/uL (ref 4.22–5.81)
RDW: 13.5 % (ref 11.5–15.5)
WBC: 15.5 10*3/uL — AB (ref 4.0–10.5)

## 2013-07-16 LAB — LIPASE, BLOOD: Lipase: 34 U/L (ref 11–59)

## 2013-07-16 MED ORDER — HYDROMORPHONE HCL PF 1 MG/ML IJ SOLN
1.0000 mg | Freq: Once | INTRAMUSCULAR | Status: AC
  Administered 2013-07-16: 1 mg via INTRAVENOUS
  Filled 2013-07-16: qty 1

## 2013-07-16 MED ORDER — ONDANSETRON HCL 4 MG/2ML IJ SOLN
4.0000 mg | Freq: Once | INTRAMUSCULAR | Status: AC
  Administered 2013-07-16: 4 mg via INTRAVENOUS
  Filled 2013-07-16: qty 2

## 2013-07-16 MED ORDER — OMEPRAZOLE 20 MG PO CPDR: 20.0000 mg | DELAYED_RELEASE_CAPSULE | Freq: Every day | ORAL | Status: DC

## 2013-07-16 MED ORDER — PROMETHAZINE HCL 25 MG PO TABS: 25.0000 mg | ORAL_TABLET | Freq: Four times a day (QID) | ORAL | Status: DC | PRN

## 2013-07-16 MED ORDER — IOHEXOL 300 MG/ML  SOLN
100.0000 mL | Freq: Once | INTRAMUSCULAR | Status: AC | PRN
  Administered 2013-07-16: 100 mL via INTRAVENOUS

## 2013-07-16 MED ORDER — HYDROMORPHONE HCL PF 2 MG/ML IJ SOLN
2.0000 mg | Freq: Once | INTRAMUSCULAR | Status: AC
  Administered 2013-07-16: 2 mg via INTRAVENOUS
  Filled 2013-07-16: qty 1

## 2013-07-16 MED ORDER — FAMOTIDINE IN NACL 20-0.9 MG/50ML-% IV SOLN
20.0000 mg | Freq: Once | INTRAVENOUS | Status: AC
  Administered 2013-07-16: 20 mg via INTRAVENOUS
  Filled 2013-07-16: qty 50

## 2013-07-16 MED ORDER — GI COCKTAIL ~~LOC~~
30.0000 mL | Freq: Once | ORAL | Status: AC
  Administered 2013-07-16: 30 mL via ORAL
  Filled 2013-07-16: qty 30

## 2013-07-16 MED ORDER — METOCLOPRAMIDE HCL 5 MG/ML IJ SOLN
10.0000 mg | Freq: Once | INTRAMUSCULAR | Status: AC
  Administered 2013-07-16: 10 mg via INTRAVENOUS
  Filled 2013-07-16: qty 2

## 2013-07-16 MED ORDER — FAMOTIDINE 10 MG PO TABS: ORAL_TABLET | ORAL | Status: DC

## 2013-07-16 MED ORDER — PANTOPRAZOLE SODIUM 40 MG IV SOLR
40.0000 mg | Freq: Once | INTRAVENOUS | Status: AC
  Administered 2013-07-16: 40 mg via INTRAVENOUS
  Filled 2013-07-16: qty 40

## 2013-07-16 NOTE — ED Provider Notes (Signed)
Pt received in sign out from PA Schinlever at shift change.  28 y.o. M with sever epigastric pain onset 2 hours PTA in the ED, started in back now in abdomen.  Pt has hx of hiatal hernia and gastritis by EGD.  Labs today are reassuring, mild leukocytosis at 15.5.   Pt given multiple doses of pain medication while waiting and was also found to be masturbating in room by nursing staff but adamantly denies this accusation.  Plan:   CT abd/pelvis pending to r/o dissection or other acute process.  If negative, pt is to be discharged with prilosec and pepcid, no narcotics as he has a pain contract.  Results for orders placed during the hospital encounter of 07/16/13  CBC WITH DIFFERENTIAL      Result Value Range   WBC 15.5 (*) 4.0 - 10.5 K/uL   RBC 4.80  4.22 - 5.81 MIL/uL   Hemoglobin 13.1  13.0 - 17.0 g/dL   HCT 16.1  09.6 - 04.5 %   MCV 82.9  78.0 - 100.0 fL   MCH 27.3  26.0 - 34.0 pg   MCHC 32.9  30.0 - 36.0 g/dL   RDW 40.9  81.1 - 91.4 %   Platelets 178  150 - 400 K/uL   Neutrophils Relative % 81 (*) 43 - 77 %   Neutro Abs 12.6 (*) 1.7 - 7.7 K/uL   Lymphocytes Relative 11 (*) 12 - 46 %   Lymphs Abs 1.8  0.7 - 4.0 K/uL   Monocytes Relative 6  3 - 12 %   Monocytes Absolute 0.9  0.1 - 1.0 K/uL   Eosinophils Relative 1  0 - 5 %   Eosinophils Absolute 0.2  0.0 - 0.7 K/uL   Basophils Relative 0  0 - 1 %   Basophils Absolute 0.0  0.0 - 0.1 K/uL  COMPREHENSIVE METABOLIC PANEL      Result Value Range   Sodium 142  137 - 147 mEq/L   Potassium 3.7  3.7 - 5.3 mEq/L   Chloride 102  96 - 112 mEq/L   CO2 28  19 - 32 mEq/L   Glucose, Bld 134 (*) 70 - 99 mg/dL   BUN 16  6 - 23 mg/dL   Creatinine, Ser 7.82  0.50 - 1.35 mg/dL   Calcium 9.3  8.4 - 95.6 mg/dL   Total Protein 7.1  6.0 - 8.3 g/dL   Albumin 4.2  3.5 - 5.2 g/dL   AST 14  0 - 37 U/L   ALT 15  0 - 53 U/L   Alkaline Phosphatase 67  39 - 117 U/L   Total Bilirubin 0.4  0.3 - 1.2 mg/dL   GFR calc non Af Amer >90  >90 mL/min   GFR calc Af  Amer >90  >90 mL/min  LIPASE, BLOOD      Result Value Range   Lipase 34  11 - 59 U/L   Ct Abdomen Pelvis W Contrast  07/16/2013   CLINICAL DATA:  Nausea and vomiting.  EXAM: CT ABDOMEN AND PELVIS WITH CONTRAST  TECHNIQUE: Multidetector CT imaging of the abdomen and pelvis was performed using the standard protocol following bolus administration of intravenous contrast.  CONTRAST:  OMNIPAQUE IOHEXOL 300 MG/ML  SOLN  COMPARISON:  CT of the abdomen and pelvis performed 01/17/2013  FINDINGS: The visualized lung bases are clear.  The liver and spleen are unremarkable in appearance. The gallbladder is within normal limits. The pancreas and adrenal glands  are unremarkable.  The kidneys are unremarkable in appearance. There is no evidence of hydronephrosis. No renal or ureteral stones are seen. No perinephric stranding is appreciated.  No free fluid is identified. The small bowel is unremarkable in appearance. The stomach is within normal limits. No acute vascular abnormalities are seen. Minimal calcification is noted along the right common iliac artery.  The appendix is normal in caliber, without evidence for appendicitis. The colon is unremarkable in appearance.  The bladder is mildly distended and grossly unremarkable in appearance. The prostate remains normal in size. The No inguinal lymphadenopathy is seen.  No acute osseous abnormalities are identified.  IMPRESSION: No acute abnormality seen in the abdomen or pelvis. No evidence of ventral abdominal wall hernia.   Electronically Signed   By: Roanna RaiderJeffery  Chang M.D.   On: 07/16/2013 06:29   6:45 AM CT negative for acute findings.  On entering pts room for re-evaluation, he was lying comfortably in bed.  Once i began speaking with him he began writhing in bed again stating he can not possibly go home.  Pts sx are likely due to his hernia and appear unchanged in presentation when compared with prior ED visits. Pt will be discharged with the recommended  prescriptions and strongly recommended close GI FU with Dr. Arlyce DiceKaplan whom he has seen in the past.  No narcotics given.  VS stable at time of discharge. Filed Vitals:   07/16/13 0445  BP: 152/91  Pulse:   Temp: 97.9 F (36.6 C)  Resp:      Garlon HatchetLisa M Zaryiah Barz, PA-C 07/16/13 [phone]

## 2013-07-16 NOTE — Discharge Instructions (Signed)
Take prilosec and pepcid as prescribed.  Take zofran as needed for nausea.  Avoid alcohol, caffeine, smoking, spicy/acidic foods and anti-inflammatory medications such as ibuprofen and aleve.  You should return to the ER if you develop fever, worsening pain or uncontrolled vomiting.

## 2013-07-16 NOTE — ED Notes (Signed)
Bed: [id number] Expected date:  Expected time:  Means of arrival:  Comments: EMS 27yo M abd pain

## 2013-07-16 NOTE — ED Notes (Signed)
Patient is alert and oriented x3.  He was given DC instructions and follow up visit instructions.  Patient gave verbal understanding.  He was DC ambulatory under his own power to home.  V/S stable.  He was not showing any signs of distress on DC 

## 2013-07-16 NOTE — ED Notes (Signed)
Per EMS pt states he has hx of hernia and is to have surgery. In route pt given 60mg  of toradol IM (right deltoid). Pt had nausea and vomiting prior to ED arrival.

## 2013-07-16 NOTE — ED Provider Notes (Signed)
CSN: [id number]     Arrival date & time 07/16/13  0302 History   First MD Initiated Contact with Patient 07/16/13 0304     Chief Complaint  Patient presents with  . Abdominal Pain   (Consider location/radiation/quality/duration/timing/severity/associated sxs/prior Treatment) HPI History provided by pt.   Pt developed severe pain in his epigastrium at work at [employer] 2 hours ago.  Pain started in his back and then shifted to abdomen; has minimal back pain currently.  Associated w/ N/V. Denies fever, chest pain, SOB, change in bowels and urinary sx.  Has h/o hiatal hernia and believes that this is the etiology of his pain.  He did not drink any alcohol or caffeine or eat anything spicy.  Per prior chart, pt admitted 12/2012 for abdominal pain and was diagnosed w/ gastroduodenitis and esophagitis via EGD.  Has h/o h.pylori.   Past Medical History  Diagnosis Date  . Hiatal hernia   . Ulcer   . Back pain, chronic   . Gastritis    Past Surgical History  Procedure Laterality Date  . Esophagogastroduodenoscopy N/A 01/12/2013    Procedure: ESOPHAGOGASTRODUODENOSCOPY (EGD);  Surgeon: Louis Meckelobert D Kaplan, MD;  [facility];  Service: Endoscopy;  Laterality: N/A;   History reviewed. No pertinent family history. History  Substance Use Topics  . Smoking status: Current Every Day Smoker  . Smokeless tobacco: Not on file  . Alcohol Use: No    Review of Systems  All other systems reviewed and are negative.    Allergies  Other  Home Medications   Current Outpatient Rx  Name  Route  Sig  Dispense  Refill  . omeprazole (PRILOSEC) 40 MG capsule   Oral   Take 40 mg by mouth 2 (two) times daily.         . ondansetron (ZOFRAN) 4 MG tablet   Oral   Take 1 tablet (4 mg total) by mouth every 8 (eight) hours as needed for nausea.   20 tablet   0   . oxyCODONE (OXY IR/ROXICODONE) 5 MG immediate release tablet   Oral   Take 5 mg by mouth every 4 (four) hours as needed for pain.         BP 147/105  Pulse 76  Temp(Src) 97.5 F (36.4 C) (Oral)  Resp 24  SpO2 100% Physical Exam  Nursing note and vitals reviewed. Constitutional: He is oriented to person, place, and time. He appears well-developed and well-nourished.  Pt rolling around bed and groaning in pain  HENT:  Head: Normocephalic and atraumatic.  Eyes:  Normal appearance  Neck: Normal range of motion.  Cardiovascular: Normal rate and regular rhythm.   Pulmonary/Chest: Effort normal and breath sounds normal. No respiratory distress.  Abdominal: Soft. Bowel sounds are normal. He exhibits no distension and no mass. There is no rebound and no guarding.  Epigastric ttp  Genitourinary:  No CVA tenderness  Musculoskeletal: Normal range of motion.  Neurological: He is alert and oriented to person, place, and time.  Skin: Skin is warm and dry. No rash noted.  Psychiatric: He has a normal mood and affect. His behavior is normal.    ED Course  Procedures (including critical care time) Labs Review Labs Reviewed  CBC WITH DIFFERENTIAL - Abnormal; Notable for the following:    WBC 15.5 (*)    Neutrophils Relative % 81 (*)    Neutro Abs 12.6 (*)    Lymphocytes Relative 11 (*)    All other components within normal  limits  COMPREHENSIVE METABOLIC PANEL - Abnormal; Notable for the following:    Glucose, Bld 134 (*)    All other components within normal limits  LIPASE, BLOOD   Imaging Review No results found.  EKG Interpretation   None       MDM   1. Gastritis    27yo M w/ h/o hiatal hernia and h.pylori as well as chronic pain and bipolar disorder, presents w/ epigastric pain and vomiting.  On initial, limited exam, pt writhing and groaning in pain, afebrile, hypertensive, abd soft/non-distended, epigastric ttp.  IV dilaudid, protonix, pepcid and zofran as well as GI cocktail ordered for sx.  Labs pending.  Nursing staff reports that patient was clearly masturbating while she was in the room  administering his dilaudid.  Pt denies this accusation and has requested another nurse.  I did not witness the incident.  3:48 AM   Pt vomited immediately following GI cocktail.  Upon entering room, he sits up and begins writhing in pain again.  Reports that his pain and nausea have not been alleviated.  Labs are sig for mild leukocytosis.   Second dose of dilaudid as well as 10mg  IV reglan ordered.  Will recheck VS and reassess shortly.  4:56 AM   Pt felt no relief w/ second 1mg  dose of dilaudid.  BP rechecked and is 152/91 in one arm and 130/90 in opposite.  CT abdomen ordered to r/o aortic dissection.  2mg  IV dilaudid ordered.   5:14 AM   Allyne Gee, PA-C, to resume care.  6:07 AM   Otilio Miu, PA-C 07/16/13 [phone]

## 2013-07-17 NOTE — ED Provider Notes (Signed)
Medical screening examination/treatment/procedure(s) were conducted as a shared visit with non-physician practitioner(s) or resident  and myself.  I personally evaluated the patient during the encounter and agree with the findings and plan unless otherwise indicated.    I have personally reviewed any xrays and/ or EKG's with the provider and I agree with interpretation.   Acute on chronic pain, worse epigastrium, worse than his normal pain, he cannot get comfortable.  Patient was caught masturbating in the room by the nurse.  Clinically pt difficult to examine as he is writhing in bed 180 degrees back and forth, ?kidney stone.  Pt has had issues with hernia and ulcers.  Abd tender central and epig, soft/ no involuntary guarding with distraction.  Pain meds given, pt improved.    Ct Abdomen Pelvis W Contrast  07/16/2013   CLINICAL DATA:  Nausea and vomiting.  EXAM: CT ABDOMEN AND PELVIS WITH CONTRAST  TECHNIQUE: Multidetector CT imaging of the abdomen and pelvis was performed using the standard protocol following bolus administration of intravenous contrast.  CONTRAST:  100mL OMNIPAQUE IOHEXOL 300 MG/ML  SOLN  COMPARISON:  CT of the abdomen and pelvis performed 01/17/2013  FINDINGS: The visualized lung bases are clear.  The liver and spleen are unremarkable in appearance. The gallbladder is within normal limits. The pancreas and adrenal glands are unremarkable.  The kidneys are unremarkable in appearance. There is no evidence of hydronephrosis. No renal or ureteral stones are seen. No perinephric stranding is appreciated.  No free fluid is identified. The small bowel is unremarkable in appearance. The stomach is within normal limits. No acute vascular abnormalities are seen. Minimal calcification is noted along the right common iliac artery.  The appendix is normal in caliber, without evidence for appendicitis. The colon is unremarkable in appearance.  The bladder is mildly distended and grossly unremarkable  in appearance. The prostate remains normal in size. The No inguinal lymphadenopathy is seen.  No acute osseous abnormalities are identified.  IMPRESSION: No acute abnormality seen in the abdomen or pelvis. No evidence of ventral abdominal wall hernia.   Electronically Signed   By: Roanna RaiderJeffery  Chang M.D.   On: 07/16/2013 06:29  CT no acute finding. DC home  Abdominal pain  Enid SkeensJoshua M Zhi Geier, MD 07/17/13 [phone]

## 2013-07-17 NOTE — ED Provider Notes (Signed)
Medical screening examination/treatment/procedure(s) were conducted as a shared visit with non-physician practitioner(s) or resident  and myself.  I personally evaluated the patient during the encounter and agree with the findings and plan unless otherwise indicated.    I have personally reviewed any xrays and/ or EKG's with the provider and I agree with interpretation.   See separate note  Enid SkeensJoshua M Eowyn Tabone, MD 07/17/13 [phone]

## 2015-05-31 ENCOUNTER — Emergency Department (HOSPITAL_COMMUNITY): Payer: Medicaid Other

## 2015-05-31 ENCOUNTER — Encounter (HOSPITAL_COMMUNITY): Payer: Self-pay | Admitting: Emergency Medicine

## 2015-05-31 ENCOUNTER — Emergency Department (HOSPITAL_COMMUNITY)
Admission: EM | Admit: 2015-05-31 | Discharge: 2015-05-31 | Disposition: A | Discharge status: Home / Self Care | Payer: Medicaid Other | Attending: Emergency Medicine | Admitting: Emergency Medicine

## 2015-05-31 DIAGNOSIS — Z79899 Other long term (current) drug therapy: Secondary | ICD-10-CM | POA: Diagnosis not present

## 2015-05-31 DIAGNOSIS — F17200 Nicotine dependence, unspecified, uncomplicated: Secondary | ICD-10-CM | POA: Diagnosis not present

## 2015-05-31 DIAGNOSIS — Z8719 Personal history of other diseases of the digestive system: Secondary | ICD-10-CM

## 2015-05-31 DIAGNOSIS — G8929 Other chronic pain: Secondary | ICD-10-CM | POA: Insufficient documentation

## 2015-05-31 DIAGNOSIS — R1013 Epigastric pain: Secondary | ICD-10-CM | POA: Diagnosis present

## 2015-05-31 DIAGNOSIS — K2970 Gastritis, unspecified, without bleeding: Secondary | ICD-10-CM | POA: Diagnosis not present

## 2015-05-31 DIAGNOSIS — Z872 Personal history of diseases of the skin and subcutaneous tissue: Secondary | ICD-10-CM | POA: Diagnosis not present

## 2015-05-31 LAB — COMPREHENSIVE METABOLIC PANEL
ALBUMIN: 4.3 g/dL (ref 3.5–5.0)
ALT: 44 U/L (ref 17–63)
AST: 39 U/L (ref 15–41)
Alkaline Phosphatase: 50 U/L (ref 38–126)
Anion gap: 8 (ref 5–15)
BUN: 17 mg/dL (ref 6–20)
CHLORIDE: 105 mmol/L (ref 101–111)
CO2: 24 mmol/L (ref 22–32)
CREATININE: 0.8 mg/dL (ref 0.61–1.24)
Calcium: 9.1 mg/dL (ref 8.9–10.3)
GFR calc Af Amer: >60 mL/min (ref >60)
GFR calc non Af Amer: >60 mL/min (ref >60)
Glucose, Bld: 118 mg/dL — ABNORMAL HIGH (ref 65–99)
POTASSIUM: 3.7 mmol/L (ref 3.5–5.1)
SODIUM: 137 mmol/L (ref 135–145)
Total Bilirubin: 0.8 mg/dL (ref 0.3–1.2)
Total Protein: 6.9 g/dL (ref 6.5–8.1)

## 2015-05-31 LAB — CBC WITH DIFFERENTIAL/PLATELET
BASOS PCT: 0 %
Basophils Absolute: 0 10*3/uL (ref 0.0–0.1)
Eosinophils Absolute: 0.1 10*3/uL (ref 0.0–0.7)
Eosinophils Relative: 1 %
HEMATOCRIT: 41.5 % (ref 39.0–52.0)
HEMOGLOBIN: 14 g/dL (ref 13.0–17.0)
Lymphocytes Relative: 9 %
Lymphs Abs: 1.2 10*3/uL (ref 0.7–4.0)
MCH: 28.3 pg (ref 26.0–34.0)
MCHC: 33.7 g/dL (ref 30.0–36.0)
MCV: 84 fL (ref 78.0–100.0)
Monocytes Absolute: 1 10*3/uL (ref 0.1–1.0)
Monocytes Relative: 7 %
NEUTROS ABS: 12.1 10*3/uL — AB (ref 1.7–7.7)
NEUTROS PCT: 83 %
PLATELETS: 162 10*3/uL (ref 150–400)
RBC: 4.94 MIL/uL (ref 4.22–5.81)
RDW: 13.3 % (ref 11.5–15.5)
WBC: 14.4 10*3/uL — ABNORMAL HIGH (ref 4.0–10.5)

## 2015-05-31 LAB — I-STAT CG4 LACTIC ACID, ED: Lactic Acid, Venous: 1.17 mmol/L (ref 0.5–2.0)

## 2015-05-31 LAB — LIPASE, BLOOD: Lipase: 27 U/L (ref 11–51)

## 2015-05-31 MED ORDER — SODIUM CHLORIDE 0.9 % IV BOLUS (SEPSIS)
1000.0000 mL | Freq: Once | INTRAVENOUS | Status: AC
  Administered 2015-05-31: 1000 mL via INTRAVENOUS

## 2015-05-31 MED ORDER — SUCRALFATE 1 G PO TABS: 1.0000 g | ORAL_TABLET | Freq: Three times a day (TID) | ORAL | Status: DC

## 2015-05-31 MED ORDER — PANTOPRAZOLE SODIUM 40 MG IV SOLR
40.0000 mg | Freq: Once | INTRAVENOUS | Status: AC
  Administered 2015-05-31: 40 mg via INTRAVENOUS
  Filled 2015-05-31: qty 40

## 2015-05-31 MED ORDER — PROMETHAZINE HCL 25 MG/ML IJ SOLN
25.0000 mg | Freq: Once | INTRAMUSCULAR | Status: AC
  Administered 2015-05-31: 25 mg via INTRAVENOUS
  Filled 2015-05-31: qty 1

## 2015-05-31 MED ORDER — ONDANSETRON 4 MG PO TBDP: 4.0000 mg | ORAL_TABLET | Freq: Three times a day (TID) | ORAL | Status: DC | PRN

## 2015-05-31 MED ORDER — ONDANSETRON HCL 4 MG/2ML IJ SOLN
4.0000 mg | Freq: Once | INTRAMUSCULAR | Status: AC
  Administered 2015-05-31: 4 mg via INTRAVENOUS
  Filled 2015-05-31: qty 2

## 2015-05-31 MED ORDER — MORPHINE SULFATE (PF) 4 MG/ML IV SOLN
4.0000 mg | Freq: Once | INTRAVENOUS | Status: AC
Start: 2015-05-31 — End: 2015-05-31
  Administered 2015-05-31: 4 mg via INTRAVENOUS
  Filled 2015-05-31: qty 1

## 2015-05-31 NOTE — ED Notes (Signed)
Bed: WA14 Expected date:  Expected time:  Means of arrival:  Comments: EMS/abd. pain 

## 2015-05-31 NOTE — Discharge Instructions (Signed)
You were seen today for abdominal pain. Her workup is reassuring. This is likely related to your known hiatal hernia and/or gastritis. Continue omeprazole at home. He will be given Zofran and Carafate. Follow up with GI if symptoms persist.  Gastritis, Adult Gastritis is soreness and swelling (inflammation) of the lining of the stomach. Gastritis can develop as a sudden onset (acute) or long-term (chronic) condition. If gastritis is not treated, it can lead to stomach bleeding and ulcers. CAUSES  Gastritis occurs when the stomach lining is weak or damaged. Digestive juices from the stomach then inflame the weakened stomach lining. The stomach lining may be weak or damaged due to viral or bacterial infections. One common bacterial infection is the Helicobacter pylori infection. Gastritis can also result from excessive alcohol consumption, taking certain medicines, or having too much acid in the stomach.  SYMPTOMS  In some cases, there are no symptoms. When symptoms are present, they may include:  Pain or a burning sensation in the upper abdomen.  Nausea.  Vomiting.  An uncomfortable feeling of fullness after eating. DIAGNOSIS  Your caregiver may suspect you have gastritis based on your symptoms and a physical exam. To determine the cause of your gastritis, your caregiver may perform the following:  Blood or stool tests to check for the H pylori bacterium.  Gastroscopy. A thin, flexible tube (endoscope) is passed down the esophagus and into the stomach. The endoscope has a light and camera on the end. Your caregiver uses the endoscope to view the inside of the stomach.  Taking a tissue sample (biopsy) from the stomach to examine under a microscope. TREATMENT  Depending on the cause of your gastritis, medicines may be prescribed. If you have a bacterial infection, such as an H pylori infection, antibiotics may be given. If your gastritis is caused by too much acid in the stomach, H2 blockers or  antacids may be given. Your caregiver may recommend that you stop taking aspirin, ibuprofen, or other nonsteroidal anti-inflammatory drugs (NSAIDs). HOME CARE INSTRUCTIONS  Only take over-the-counter or prescription medicines as directed by your caregiver.  If you were given antibiotic medicines, take them as directed. Finish them even if you start to feel better.  Drink enough fluids to keep your urine clear or pale yellow.  Avoid foods and drinks that make your symptoms worse, such as:  Caffeine or alcoholic drinks.  Chocolate.  Peppermint or mint flavorings.  Garlic and onions.  Spicy foods.  Citrus fruits, such as oranges, lemons, or limes.  Tomato-based foods such as sauce, chili, salsa, and pizza.  Fried and fatty foods.  Eat small, frequent meals instead of large meals. SEEK IMMEDIATE MEDICAL CARE IF:   You have black or dark red stools.  You vomit blood or material that looks like coffee grounds.  You are unable to keep fluids down.  Your abdominal pain gets worse.  You have a fever.  You do not feel better after 1 week.  You have any other questions or concerns. MAKE SURE YOU:  Understand these instructions.  Will watch your condition.  Will get help right away if you are not doing well or get worse.   This information is not intended to replace advice given to you by your health care provider. Make sure you discuss any questions you have with your health care provider.   Document Released: 06/09/2001 Document Revised: 12/15/2011 Document Reviewed: 07/29/2011 Elsevier Interactive Patient Education [employer]2016 Elsevier Inc.

## 2015-05-31 NOTE — ED Notes (Signed)
Pt's uncle is on his way to pick him up.

## 2015-05-31 NOTE — ED Notes (Signed)
Pt here via EMS for abdominal pain. Pt was vomiting on the scene. He was given 50 mcg of fentanyl and 4mg  of zofran in route. Pt described his pain as 10/10 in route, but is resting quietly at this moment

## 2015-05-31 NOTE — ED Notes (Signed)
Pt to xray

## 2015-05-31 NOTE — ED Provider Notes (Signed)
CSN: [id number]     Arrival date & time 05/31/15  0410 History   First MD Initiated Contact with Patient 05/31/15 0421     Chief Complaint  Patient presents with  . Abdominal Pain     (Consider location/radiation/quality/duration/timing/severity/associated sxs/prior Treatment) HPI  This is a 29 year old male with history of hiatal hernia, gastritis who presents with abdominal pain. Patient reports onset of epigastric abdominal pain this morning. He reports that it is sharp and radiates to his back. He has had similar pain in the past. He takes omeprazole at home. Current pain is 10 out of 10. He received fentanyl and Zofran in route. He reports vomiting is nonbilious and nonbloody. He believes this is related to his hiatal hernia. He reports normal bowel movements. No fever.  Past Medical History  Diagnosis Date  . Hiatal hernia   . Ulcer   . Back pain, chronic   . Gastritis    Past Surgical History  Procedure Laterality Date  . Esophagogastroduodenoscopy N/A 01/12/2013    Procedure: ESOPHAGOGASTRODUODENOSCOPY (EGD);  Surgeon: Louis Meckel, MD;  Location: [facility] ENDOSCOPY;  Service: Endoscopy;  Laterality: N/A;  . Hernia repair     No family history on file. Social History  Substance Use Topics  . Smoking status: Current Every Day Smoker  . Smokeless tobacco: None  . Alcohol Use: No    Review of Systems  Constitutional: Negative for fever.  Respiratory: Negative for chest tightness and shortness of breath.   Cardiovascular: Negative for chest pain.  Gastrointestinal: Positive for nausea, vomiting and abdominal pain. Negative for diarrhea and constipation.  Genitourinary: Negative for dysuria and decreased urine volume.  All other systems reviewed and are negative.     Allergies  Other  Home Medications   Prior to Admission medications   Medication Sig Start Date End Date Taking? Authorizing Provider  famotidine (PEPCID AC) 10 MG tablet One po bid x 3 days and then  prn 07/16/13   Ruby Cola, PA-C  methadone (DOLOPHINE) 10 MG/ML solution Take 120 mg by mouth every morning.    Historical Provider, MD  omeprazole (PRILOSEC) 20 MG capsule Take 1 capsule (20 mg total) by mouth daily. 07/16/13   Ruby Cola, PA-C  omeprazole (PRILOSEC) 40 MG capsule Take 40 mg by mouth 2 (two) times daily.    Historical Provider, MD  ondansetron (ZOFRAN ODT) 4 MG disintegrating tablet Take 1 tablet (4 mg total) by mouth every 8 (eight) hours as needed for nausea or vomiting. 05/31/15   Shon Baton, MD  promethazine (PHENERGAN) 25 MG tablet Take 1 tablet (25 mg total) by mouth every 6 (six) hours as needed for nausea or vomiting. 07/16/13   Catherine Schinlever, PA-C  sucralfate (CARAFATE) 1 G tablet Take 1 tablet (1 g total) by mouth 4 (four) times daily -  with meals and at bedtime. 05/31/15   Shon Baton, MD   BP 150/90 mmHg  Pulse 77  Temp(Src) 98 F (36.7 C) (Oral)  Resp 20  Ht  (1.956 m)  Wt 225 lb (102.059 kg)  BMI 26.68 kg/m2  SpO2 100% Physical Exam  Constitutional: He is oriented to person, place, and time. No distress.  Initially resting quietly but upon my arrival, patient now writhing around in the bed  HENT:  Head: Normocephalic and atraumatic.  Mouth/Throat: Oropharynx is clear and moist.  Cardiovascular: Normal rate, regular rhythm and normal heart sounds.   No murmur heard. Pulmonary/Chest: Effort normal and breath sounds normal.  No respiratory distress. He has no wheezes.  Abdominal: Soft. Bowel sounds are normal. There is tenderness. There is no rebound and no guarding.  Epigastric tenderness to palpation without rebound or guarding  Musculoskeletal: He exhibits no edema.  Neurological: He is alert and oriented to person, place, and time.  Skin: Skin is warm and dry.  Psychiatric: He has a normal mood and affect.  Nursing note and vitals reviewed.   ED Course  Procedures (including critical care time) Labs  Review Labs Reviewed  CBC WITH DIFFERENTIAL/PLATELET - Abnormal; Notable for the following:    WBC 14.4 (*)    Neutro Abs 12.1 (*)    All other components within normal limits  COMPREHENSIVE METABOLIC PANEL - Abnormal; Notable for the following:    Glucose, Bld 118 (*)    All other components within normal limits  LIPASE, BLOOD  I-STAT CG4 LACTIC ACID, ED    Imaging Review Dg Abd Acute W/chest  05/31/2015  CLINICAL DATA:  Umbilical pain beginning yesterday afternoon, now worsening. History of hiatal hernia. EXAM: DG ABDOMEN ACUTE W/ 1V CHEST COMPARISON:  CT abdomen and pelvis July 16, 2013 FINDINGS: Cardiomediastinal silhouette is unremarkable for this low inspiratory portable examination with crowded vasculature markings. The lungs are clear without pleural effusions or focal consolidations. Trachea projects midline and there is no pneumothorax. Included soft tissue planes and osseous structures are non-suspicious. Bowel gas pattern is nondilated and nonobstructive. No intra-abdominal mass effect, pathologic calcifications or free air. Soft tissue planes and included osseous structures are non-suspicious. IMPRESSION: No acute cardiopulmonary process for this low inspiratory portable examination. Nonspecific bowel gas pattern. Electronically Signed   By: Awilda Metroourtnay  Bloomer M.D.   On: 05/31/2015 05:02   I have personally reviewed and evaluated these images and lab results as part of my medical decision-making.   EKG Interpretation None      MDM   Final diagnoses:  Gastritis  H/O hiatal hernia    Patient presents with epigastric pain. History of hiatal hernia and gastritis. Has been seen and evaluated for the same in the past. Uncomfortable appearing but nontoxic on exam. Basic labwork obtained including CBC, CMP, lipase, and lactate. All is reassuring. Acute abdominal series without evidence of obstruction or free air. Patient was given fluids, pain and nausea medicine.    6:24  AM On recheck, patient reports improvement of pain and nausea. He does report some mild persistent nausea. He was given fluids. Discussed with patient symptoms control at home and follow-up with GI if symptoms persist.  After history, exam, and medical workup I feel the patient has been appropriately medically screened and is safe for discharge home. Pertinent diagnoses were discussed with the patient. Patient was given return precautions.     Shon Batonourtney F Trevon Strothers, MD 05/31/15 [phone]

## 2016-10-06 ENCOUNTER — Other Ambulatory Visit: Payer: Self-pay | Admitting: Gastroenterology

## 2016-10-06 DIAGNOSIS — K449 Diaphragmatic hernia without obstruction or gangrene: Secondary | ICD-10-CM

## 2016-10-14 ENCOUNTER — Ambulatory Visit
Admission: RE | Admit: 2016-10-14 | Discharge: 2016-10-14 | Disposition: A | Discharge status: Home / Self Care | Payer: Medicaid Other | Source: Ambulatory Visit | Attending: Gastroenterology | Admitting: Gastroenterology

## 2016-10-14 DIAGNOSIS — K449 Diaphragmatic hernia without obstruction or gangrene: Secondary | ICD-10-CM

## 2017-01-08 ENCOUNTER — Encounter (HOSPITAL_COMMUNITY)
Admission: RE | Disposition: A | Discharge status: Home / Self Care | Payer: Self-pay | Source: Ambulatory Visit | Attending: Gastroenterology

## 2017-01-08 ENCOUNTER — Ambulatory Visit (HOSPITAL_COMMUNITY)
Admission: RE | Admit: 2017-01-08 | Discharge: 2017-01-08 | Disposition: A | Discharge status: Home / Self Care | Payer: Medicaid Other | Source: Ambulatory Visit | Attending: Gastroenterology | Admitting: Gastroenterology

## 2017-01-08 DIAGNOSIS — K228 Other specified diseases of esophagus: Secondary | ICD-10-CM | POA: Insufficient documentation

## 2017-01-08 DIAGNOSIS — R1110 Vomiting, unspecified: Secondary | ICD-10-CM | POA: Insufficient documentation

## 2017-01-08 HISTORY — PX: ESOPHAGEAL MANOMETRY: SHX5429

## 2017-01-08 SURGERY — MANOMETRY, ESOPHAGUS

## 2017-01-08 MED ORDER — LIDOCAINE VISCOUS 2 % MT SOLN
OROMUCOSAL | Status: AC
  Filled 2017-01-08: qty 15

## 2017-01-08 SURGICAL SUPPLY — 2 items
FACESHIELD LNG OPTICON STERILE (SAFETY) IMPLANT
GLOVE BIO SURGEON STRL SZ8 (GLOVE) ×6 IMPLANT

## 2017-01-08 NOTE — Progress Notes (Signed)
Esophageal Manometry done per protocol with 2nd RN at bedside for emotional support. Pt tolerated fair with some emotional coaching. No complications. Report to be sent to Dr. Luan MooreGanem's office.

## 2017-01-11 ENCOUNTER — Encounter (HOSPITAL_COMMUNITY): Payer: Self-pay | Admitting: Gastroenterology

## 2017-03-05 ENCOUNTER — Ambulatory Visit: Payer: Self-pay | Admitting: General Surgery

## 2017-03-05 NOTE — H&P (Signed)
History of Present Illness (Alex Minardi MD; 03/05/2017 10:59 AM) The patient is a 31 year old male who presents with a hiatal hernia. Patient returns back today after manometry study. Manometry study revealed no aperistalsis, no signs of achalasia. There does appear to be a decreased tone of the lower esophageal sphincter muscle spasm. Patient states he continues with regurgitation. He continues to have to sleep upright. He continues to have chest pain with eating and with bowel movements. Regurgitation continues to help with his chest pain.    Medication History ([name], RMA; 03/05/2017 10:38 AM) marijuana (daily) Active. Omeprazole (40MG Capsule DR, Oral) Active. Methadone HCl (Oral) Specific strength unknown - Active. Medications Reconciled    Review of Systems ([name], MD; 03/05/2017 11:0 AM) General Present- Appetite Loss and Fatigue. Not Present- Chills, Fever, Night Sweats, Weight Gain and Weight Loss. HEENT Present- Ringing in the Ears. Not Present- Earache, Hearing Loss, Hoarseness, Nose Bleed, Oral Ulcers, Seasonal Allergies, Sinus Pain, Sore Throat, Visual Disturbances, Wears glasses/contact lenses and Yellow Eyes. Breast Present- Breast Mass and Nipple Discharge. Not Present- Breast Pain and Skin Changes. Cardiovascular Present- Shortness of Breath and Swelling of Extremities. Not Present- Chest Pain, Difficulty Breathing Lying Down, Leg Cramps, Palpitations and Rapid Heart Rate. Gastrointestinal Present- Abdominal Pain, Bloating, Change in Bowel Habits, Constipation, Difficulty Swallowing, Excessive gas, Gets full quickly at meals, Hemorrhoids, Nausea and Vomiting. Not Present- Bloody Stool, Chronic diarrhea, Indigestion and Rectal Pain. Male Genitourinary Not Present- Blood in Urine, Change in Urinary Stream, Frequency, Impotence, Nocturia, Painful Urination, Urgency and Urine Leakage. Musculoskeletal Present- Back Pain, Joint Pain and Swelling of  Extremities. Not Present- Joint Stiffness, Muscle Pain and Muscle Weakness. Neurological Present- Decreased Memory. Not Present- Fainting, Headaches, Numbness, Seizures, Tingling, Tremor, Trouble walking and Weakness. Endocrine Present- Hair Changes and New Diabetes. Not Present- Cold Intolerance, Excessive Hunger and Heat Intolerance.  Vitals ([name] RMA; 03/05/2017 10:39 AM) 03/05/2017 10:38 AM Weight: 255.6 lb Height: 72in Body Surface Area: 2.36 m Body Mass Index: 34.67 kg/m  Temp.: 98.7F  Pulse: 78 (Regular)  BP: 120/70 (Sitting, Left Arm, Standard)       Physical Exam ([name] MD; 03/05/2017 10:59 AM) The physical exam findings are as follows: Note:Constitutional: No acute distress, conversant, appears stated age  Eyes: Anicteric sclerae, moist conjunctiva, no lid lag  Neck: No thyromegaly, trachea midline, no cervical lymphadenopathy  Lungs: Clear to auscultation biilaterally, normal respiratory effot  Cardiovascular: regular rate & rhythm, no murmurs, no peripheal edema, pedal pulses 2+  GI: Soft, no masses or hepatosplenomegaly, non-tender to palpation  MSK: Normal gait, no clubbing cyanosis, edema  Skin: No rashes, palpation reveals normal skin turgor  Psychiatric: Appropriate judgment and insight, oriented to person, place, and time    Assessment & Plan ([name] MD; 03/05/2017 11:00 AM) HIATAL HERNIA WITH GERD (K21.9) Impression: 31-year-old male with a hiatal hernia type 1 with GERD.  1. The patient will like to proceed to the operating room for a robotic hiatal hernia repair with Nissen fundoplication. 2. I discussed with the patient the risks and benefits of the procedure to include but not limited to: Infection, bleeding, damage try to traverse, possible pneumothorax, possible need for further surgery and recurrence. The patient voices understanding and wishes to proceed. 

## 2017-05-07 ENCOUNTER — Ambulatory Visit: Admit: 2017-05-07 | Payer: Self-pay | Admitting: General Surgery

## 2017-05-07 SURGERY — FUNDOPLICATION, NISSEN, ROBOT-ASSISTED, LAPAROSCOPIC
Anesthesia: General

## 2017-11-05 ENCOUNTER — Other Ambulatory Visit: Payer: Self-pay

## 2017-11-05 ENCOUNTER — Encounter (HOSPITAL_COMMUNITY): Payer: Self-pay | Admitting: Emergency Medicine

## 2017-11-05 ENCOUNTER — Emergency Department (HOSPITAL_COMMUNITY)
Admission: EM | Admit: 2017-11-05 | Discharge: 2017-11-05 | Disposition: A | Discharge status: Home / Self Care | Payer: Self-pay | Attending: Emergency Medicine | Admitting: Emergency Medicine

## 2017-11-05 DIAGNOSIS — R109 Unspecified abdominal pain: Secondary | ICD-10-CM | POA: Insufficient documentation

## 2017-11-05 DIAGNOSIS — Z5321 Procedure and treatment not carried out due to patient leaving prior to being seen by health care provider: Secondary | ICD-10-CM | POA: Insufficient documentation

## 2017-11-05 LAB — COMPREHENSIVE METABOLIC PANEL
ALBUMIN: 4.1 g/dL (ref 3.5–5.0)
ALK PHOS: 55 U/L (ref 38–126)
ALT: 14 U/L — AB (ref 17–63)
AST: 23 U/L (ref 15–41)
Anion gap: 11 (ref 5–15)
BILIRUBIN TOTAL: 1.6 mg/dL — AB (ref 0.3–1.2)
BUN: 16 mg/dL (ref 6–20)
CO2: 27 mmol/L (ref 22–32)
CREATININE: 1.29 mg/dL — AB (ref 0.61–1.24)
Calcium: 9.5 mg/dL (ref 8.9–10.3)
Chloride: 103 mmol/L (ref 101–111)
GFR calc Af Amer: >60 mL/min (ref >60)
GFR calc non Af Amer: >60 mL/min (ref >60)
GLUCOSE: 106 mg/dL — AB (ref 65–99)
Potassium: 4.3 mmol/L (ref 3.5–5.1)
Sodium: 141 mmol/L (ref 135–145)
Total Protein: 6.7 g/dL (ref 6.5–8.1)

## 2017-11-05 LAB — CBC
HCT: 43.9 % (ref 39.0–52.0)
Hemoglobin: 14.5 g/dL (ref 13.0–17.0)
MCH: 28.1 pg (ref 26.0–34.0)
MCHC: 33 g/dL (ref 30.0–36.0)
MCV: 85.1 fL (ref 78.0–100.0)
PLATELETS: 188 10*3/uL (ref 150–400)
RBC: 5.16 MIL/uL (ref 4.22–5.81)
RDW: 13.7 % (ref 11.5–15.5)
WBC: 9.3 10*3/uL (ref 4.0–10.5)

## 2017-11-05 LAB — LIPASE, BLOOD: Lipase: 28 U/L (ref 11–51)

## 2017-11-05 NOTE — ED Notes (Signed)
Pt called x 3 for xray with no answer and called x 2 by this nurse with no answer. Pt removed from the system at this time as LWBS

## 2017-11-05 NOTE — ED Notes (Signed)
Pt not cooperative in triage refusing to hold still for assessment, blood draw, or ekg. Loudly screamed "man what the fuck are yall doing man call my gastroenterologist" when pt instructed that there is no need to be verbally aggressive and curse pt got up out of the wheelchair and ambulated out of the emergency department.

## 2017-11-05 NOTE — ED Triage Notes (Signed)
Pt states he woke up to use the bathroom and when he stood up he had severe upper abdominal pain N/V. Pt is noted to be tearful, hysterical, and diaphoretic in triage.

## 2017-11-05 NOTE — ED Triage Notes (Signed)
Pt also reports that he was supposed to have a hiatal hernia surgery this month but it was cancelled for insurance reasons.

## 2018-03-29 ENCOUNTER — Emergency Department (HOSPITAL_COMMUNITY): Payer: Self-pay

## 2018-03-29 ENCOUNTER — Emergency Department (HOSPITAL_COMMUNITY)
Admission: EM | Admit: 2018-03-29 | Discharge: 2018-03-29 | Disposition: A | Discharge status: Home / Self Care | Payer: Self-pay | Attending: Emergency Medicine | Admitting: Emergency Medicine

## 2018-03-29 ENCOUNTER — Encounter (HOSPITAL_COMMUNITY): Payer: Self-pay | Admitting: Emergency Medicine

## 2018-03-29 DIAGNOSIS — K2950 Unspecified chronic gastritis without bleeding: Secondary | ICD-10-CM | POA: Insufficient documentation

## 2018-03-29 DIAGNOSIS — Z79899 Other long term (current) drug therapy: Secondary | ICD-10-CM | POA: Insufficient documentation

## 2018-03-29 DIAGNOSIS — F17200 Nicotine dependence, unspecified, uncomplicated: Secondary | ICD-10-CM | POA: Insufficient documentation

## 2018-03-29 DIAGNOSIS — Z8719 Personal history of other diseases of the digestive system: Secondary | ICD-10-CM | POA: Insufficient documentation

## 2018-03-29 LAB — CBC
HEMATOCRIT: 43.8 % (ref 39.0–52.0)
Hemoglobin: 14.2 g/dL (ref 13.0–17.0)
MCH: 27.5 pg (ref 26.0–34.0)
MCHC: 32.4 g/dL (ref 30.0–36.0)
MCV: 84.9 fL (ref 78.0–100.0)
Platelets: 208 10*3/uL (ref 150–400)
RBC: 5.16 MIL/uL (ref 4.22–5.81)
RDW: 13 % (ref 11.5–15.5)
WBC: 15.4 10*3/uL — ABNORMAL HIGH (ref 4.0–10.5)

## 2018-03-29 LAB — COMPREHENSIVE METABOLIC PANEL
ALBUMIN: 4.1 g/dL (ref 3.5–5.0)
ALK PHOS: 54 U/L (ref 38–126)
ALT: 22 U/L (ref 0–44)
AST: 18 U/L (ref 15–41)
Anion gap: 14 (ref 5–15)
BILIRUBIN TOTAL: 1.2 mg/dL (ref 0.3–1.2)
BUN: 16 mg/dL (ref 6–20)
CALCIUM: 9.6 mg/dL (ref 8.9–10.3)
CO2: 21 mmol/L — ABNORMAL LOW (ref 22–32)
Chloride: 104 mmol/L (ref 98–111)
Creatinine, Ser: 1.08 mg/dL (ref 0.61–1.24)
GFR calc Af Amer: >60 mL/min (ref >60)
GFR calc non Af Amer: >60 mL/min (ref >60)
GLUCOSE: 142 mg/dL — AB (ref 70–99)
POTASSIUM: 4.2 mmol/L (ref 3.5–5.1)
Sodium: 139 mmol/L (ref 135–145)
TOTAL PROTEIN: 6.7 g/dL (ref 6.5–8.1)

## 2018-03-29 LAB — LIPASE, BLOOD: Lipase: 24 U/L (ref 11–51)

## 2018-03-29 MED ORDER — GI COCKTAIL ~~LOC~~
30.0000 mL | Freq: Once | ORAL | Status: AC
  Administered 2018-03-29: 30 mL via ORAL
  Filled 2018-03-29: qty 30

## 2018-03-29 MED ORDER — PANTOPRAZOLE SODIUM 40 MG PO TBEC: 40.0000 mg | DELAYED_RELEASE_TABLET | Freq: Once | ORAL | Status: DC

## 2018-03-29 MED ORDER — ONDANSETRON HCL 4 MG/2ML IJ SOLN
4.0000 mg | Freq: Once | INTRAMUSCULAR | Status: AC
  Administered 2018-03-29: 4 mg via INTRAVENOUS
  Filled 2018-03-29: qty 2

## 2018-03-29 MED ORDER — MORPHINE SULFATE (PF) 4 MG/ML IV SOLN
4.0000 mg | Freq: Once | INTRAVENOUS | Status: AC
  Administered 2018-03-29: 4 mg via INTRAVENOUS
  Filled 2018-03-29: qty 1

## 2018-03-29 MED ORDER — OMEPRAZOLE 40 MG PO CPDR: 40.0000 mg | DELAYED_RELEASE_CAPSULE | Freq: Two times a day (BID) | ORAL | 1 refills | Status: AC

## 2018-03-29 MED ORDER — ONDANSETRON 4 MG PO TBDP: 4.0000 mg | ORAL_TABLET | Freq: Three times a day (TID) | ORAL | 0 refills | Status: DC | PRN

## 2018-03-29 NOTE — Discharge Instructions (Addendum)
You were seen today for abdominal pain.  This is likely related to known ulcer or gastritis.  Follow-up with gastroenterology.  Reinitiate omeprazole.  You will also be given Zofran.

## 2018-03-29 NOTE — ED Notes (Signed)
Patient transported to X-ray 

## 2018-03-29 NOTE — ED Triage Notes (Signed)
Pt arrived POV with friend, pt removed from vehicle rolling around in seat, minimally following directions, sobbing and heaving reporting epigastric pain  Pt reports he is to have procedure to repair hiatal hernia.

## 2018-03-29 NOTE — ED Provider Notes (Signed)
[facility] EMERGENCY DEPARTMENT Provider Note   CSN: [id number] Arrival date & time: 03/29/18  4098     History   Chief Complaint Chief Complaint  Patient presents with  . Abdominal Pain    HPI Alex Farmer is a 32 y.o. male.  HPI  This is a 32 year old male with a history of ulcer, gastritis, hiatal hernia who presents with epigastric pain and vomiting.  Onset of symptoms this morning at 1 AM.  States that he woke up with his pain.  He reports that it feels like "someone is punching me in my stomach."  Rates his pain at 10 out of 10.  Has had multiple episodes of nonbilious, nonbloody emesis.  Denies diarrhea or constipation.  Pain does not radiate.  He has had similar pain in the past.  Denies any recent alcohol or NSAID use.  Denies any bloody stools.  Not currently taking his omeprazole.  Past Medical History:  Diagnosis Date  . Back pain, chronic   . Gastritis   . Hiatal hernia   . Ulcer     Patient Active Problem List   Diagnosis Date Noted  . Chronic hand pain 01/18/2013  . GERD (gastroesophageal reflux disease) 01/18/2013  . Marijuana smoker 01/18/2013  . Unspecified gastritis and gastroduodenitis without mention of hemorrhage 01/12/2013  . Erosive esophagitis 01/12/2013  . Hiatal hernia 01/12/2013  . Coffee ground emesis 01/11/2013  . Constipation 01/10/2013  . Abdominal pain 01/10/2013  . Chronic pain 01/10/2013    Past Surgical History:  Procedure Laterality Date  . ESOPHAGEAL MANOMETRY N/A 01/08/2017   Procedure: ESOPHAGEAL MANOMETRY (EM);  Surgeon: Graylin Shiver, MD;  Location: [facility] ENDOSCOPY;  Service: Endoscopy;  Laterality: N/A;  . ESOPHAGOGASTRODUODENOSCOPY N/A 01/12/2013   Procedure: ESOPHAGOGASTRODUODENOSCOPY (EGD);  Surgeon: Louis Meckel, MD;  Location: [facility] ENDOSCOPY;  Service: Endoscopy;  Laterality: N/A;  . HERNIA REPAIR          Home Medications    Prior to Admission medications   Medication Sig Start Date End Date  Taking? Authorizing Provider  famotidine (PEPCID AC) 10 MG tablet One po bid x 3 days and then prn 07/16/13   Schinlever, Santina Evans, PA-C  methadone (DOLOPHINE) 10 MG/ML solution Take 120 mg by mouth every morning.    [provider]  omeprazole (PRILOSEC) 20 MG capsule Take 1 capsule (20 mg total) by mouth daily. 07/16/13   Schinlever, Santina Evans, PA-C  omeprazole (PRILOSEC) 40 MG capsule Take 40 mg by mouth 2 (two) times daily.    [provider]  omeprazole (PRILOSEC) 40 MG capsule Take 1 capsule (40 mg total) by mouth 2 (two) times daily before a meal. 03/29/18   Tevion Laforge, Mayer Masker, MD  ondansetron (ZOFRAN ODT) 4 MG disintegrating tablet Take 1 tablet (4 mg total) by mouth every 8 (eight) hours as needed for nausea or vomiting. 05/31/15   Rion Catala, Mayer Masker, MD  ondansetron (ZOFRAN ODT) 4 MG disintegrating tablet Take 1 tablet (4 mg total) by mouth every 8 (eight) hours as needed for nausea or vomiting. 03/29/18   Jehieli Brassell, Mayer Masker, MD  promethazine (PHENERGAN) 25 MG tablet Take 1 tablet (25 mg total) by mouth every 6 (six) hours as needed for nausea or vomiting. 07/16/13   Schinlever, Santina Evans, PA-C  sucralfate (CARAFATE) 1 G tablet Take 1 tablet (1 g total) by mouth 4 (four) times daily -  with meals and at bedtime. 05/31/15   Demetris Capell, Mayer Masker, MD    Family History No  family history on file.  Social History Social History   Tobacco Use  . Smoking status: Current Every Day Smoker  . Smokeless tobacco: Never Used  Substance Use Topics  . Alcohol use: No  . Drug use: Yes    Types: Marijuana     Allergies   Other   Review of Systems Review of Systems  Constitutional: Negative for fever.  Respiratory: Negative for shortness of breath.   Cardiovascular: Negative for chest pain.  Gastrointestinal: Positive for abdominal pain, nausea and vomiting. Negative for constipation and diarrhea.  Genitourinary: Negative for dysuria.  All other systems reviewed and are  negative.    Physical Exam Updated Vital Signs BP (!) 129/100   Pulse (!) 52   Temp 98 F (36.7 C) (Oral)   Resp 16   SpO2 98%   Physical Exam  Constitutional: He is oriented to person, place, and time. He appears well-developed and well-nourished.  HENT:  Head: Normocephalic and atraumatic.  Eyes: Pupils are equal, round, and reactive to light.  Neck: Neck supple.  Cardiovascular: Normal rate, regular rhythm and normal heart sounds.  No murmur heard. Pulmonary/Chest: Effort normal and breath sounds normal. No respiratory distress. He has no wheezes.  Abdominal: Soft. Bowel sounds are normal. There is tenderness in the epigastric area. There is no rebound and no guarding.  Musculoskeletal: He exhibits no edema.  Lymphadenopathy:    He has no cervical adenopathy.  Neurological: He is alert and oriented to person, place, and time.  Skin: Skin is warm and dry.  Psychiatric: He has a normal mood and affect.  Nursing note and vitals reviewed.    ED Treatments / Results  Labs (all labs ordered are listed, but only abnormal results are displayed) Labs Reviewed  COMPREHENSIVE METABOLIC PANEL - Abnormal; Notable for the following components:      Result Value   CO2 21 (*)    Glucose, Bld 142 (*)    All other components within normal limits  CBC - Abnormal; Notable for the following components:   WBC 15.4 (*)    All other components within normal limits  LIPASE, BLOOD  URINALYSIS, ROUTINE W REFLEX MICROSCOPIC    EKG None  Radiology Dg Abdomen Acute W/chest  Result Date: 03/29/2018 CLINICAL DATA:  Mid to upper abdominal pain. EXAM: DG ABDOMEN ACUTE W/ 1V CHEST COMPARISON:  CT 12/10/2017 FINDINGS: The cardiomediastinal contours are normal. The lungs are clear. There is no free intra-abdominal air. No dilated bowel loops to suggest obstruction. Small volume of stool throughout the colon. No radiopaque calculi. No acute osseous abnormalities are seen. Hemi transitional  lumbosacral anatomy. IMPRESSION: Negative abdominal radiographs.  No acute cardiopulmonary disease. Electronically Signed   By: Narda Rutherford M.D.   On: 03/29/2018 06:53    Procedures Procedures (including critical care time)  Medications Ordered in ED Medications  pantoprazole (PROTONIX) EC tablet 40 mg (has no administration in time range)  ondansetron (ZOFRAN) injection 4 mg (4 mg Intravenous Given 03/29/18 0630)  morphine 4 MG/ML injection 4 mg (4 mg Intravenous Given 03/29/18 0630)  gi cocktail (Maalox,Lidocaine,Donnatal) (30 mLs Oral Given 03/29/18 0630)     Initial Impression / Assessment and Plan / ED Course  I have reviewed the triage vital signs and the nursing notes.  Pertinent labs & imaging results that were available during my care of the patient were reviewed by me and considered in my medical decision making (see chart for details).     Patient presents with epigastric  pain.  History of the same.  History of ulcers, gastritis, hernia.  He is uncomfortable but nontoxic-appearing vital signs are reassuring.  Tenderness on exam without signs of peritonitis.  Considerations include recurrent gastritis, ulcer, pancreatitis.  Patient was given pain and nausea medication and a GI cocktail.  His work-up is largely reassuring.  Lab work-up is negative.  Acute abdominal series without air-fluid levels.  On recheck, patient reports some improvement after treatment specifically GI cocktail.  Suspect gastritis versus ulcer.  Recommend reinitiating omeprazole.  He is able to tolerate fluids and is requesting discharge.  He needs follow-up with gastroenterology.  After history, exam, and medical workup I feel the patient has been appropriately medically screened and is safe for discharge home. Pertinent diagnoses were discussed with the patient. Patient was given return precautions.   Final Clinical Impressions(s) / ED Diagnoses   Final diagnoses:  Other chronic gastritis without  hemorrhage  History of hiatal hernia    ED Discharge Orders         Ordered    ondansetron (ZOFRAN ODT) 4 MG disintegrating tablet  Every 8 hours PRN     03/29/18 0721    omeprazole (PRILOSEC) 40 MG capsule  2 times daily before meals     03/29/18 0721           Shon Baton, MD 03/29/18 [id number]

## 2019-01-06 ENCOUNTER — Other Ambulatory Visit (HOSPITAL_COMMUNITY): Payer: Self-pay | Admitting: Gastroenterology

## 2019-01-06 ENCOUNTER — Other Ambulatory Visit: Payer: Self-pay | Admitting: Gastroenterology

## 2019-01-06 DIAGNOSIS — K219 Gastro-esophageal reflux disease without esophagitis: Secondary | ICD-10-CM

## 2019-01-06 DIAGNOSIS — R110 Nausea: Secondary | ICD-10-CM

## 2019-01-13 ENCOUNTER — Other Ambulatory Visit: Payer: Self-pay | Admitting: Gastroenterology

## 2019-01-20 ENCOUNTER — Other Ambulatory Visit: Payer: Self-pay

## 2019-01-20 ENCOUNTER — Ambulatory Visit (HOSPITAL_COMMUNITY)
Admission: RE | Admit: 2019-01-20 | Discharge: 2019-01-20 | Disposition: A | Discharge status: Home / Self Care | Payer: Medicaid Other | Source: Ambulatory Visit | Attending: Gastroenterology | Admitting: Gastroenterology

## 2019-01-20 DIAGNOSIS — K219 Gastro-esophageal reflux disease without esophagitis: Secondary | ICD-10-CM

## 2019-01-20 DIAGNOSIS — R110 Nausea: Secondary | ICD-10-CM

## 2019-01-20 MED ORDER — TECHNETIUM TC 99M SULFUR COLLOID
2.0000 | Freq: Once | INTRAVENOUS | Status: AC | PRN
  Administered 2019-01-20: 2 via ORAL

## 2020-03-28 ENCOUNTER — Other Ambulatory Visit: Payer: Self-pay

## 2020-03-28 DIAGNOSIS — I872 Venous insufficiency (chronic) (peripheral): Secondary | ICD-10-CM

## 2020-04-16 ENCOUNTER — Encounter: Payer: Medicaid Other | Admitting: Vascular Surgery

## 2020-04-16 ENCOUNTER — Inpatient Hospital Stay (HOSPITAL_COMMUNITY): Admission: RE | Admit: 2020-04-16 | Payer: Medicaid Other | Source: Ambulatory Visit

## 2020-11-27 ENCOUNTER — Emergency Department (HOSPITAL_COMMUNITY): Payer: Medicaid Other

## 2020-11-27 ENCOUNTER — Emergency Department (HOSPITAL_COMMUNITY)
Admission: EM | Admit: 2020-11-27 | Discharge: 2020-11-27 | Disposition: A | Discharge status: Home / Self Care | Payer: Medicaid Other | Attending: Emergency Medicine | Admitting: Emergency Medicine

## 2020-11-27 ENCOUNTER — Other Ambulatory Visit: Payer: Self-pay

## 2020-11-27 ENCOUNTER — Encounter (HOSPITAL_COMMUNITY): Payer: Self-pay

## 2020-11-27 ENCOUNTER — Emergency Department (HOSPITAL_BASED_OUTPATIENT_CLINIC_OR_DEPARTMENT_OTHER): Payer: Medicaid Other

## 2020-11-27 DIAGNOSIS — M7989 Other specified soft tissue disorders: Secondary | ICD-10-CM | POA: Diagnosis not present

## 2020-11-27 DIAGNOSIS — M79606 Pain in leg, unspecified: Secondary | ICD-10-CM | POA: Diagnosis not present

## 2020-11-27 DIAGNOSIS — M25562 Pain in left knee: Secondary | ICD-10-CM | POA: Diagnosis not present

## 2020-11-27 DIAGNOSIS — R0602 Shortness of breath: Secondary | ICD-10-CM | POA: Insufficient documentation

## 2020-11-27 DIAGNOSIS — F17200 Nicotine dependence, unspecified, uncomplicated: Secondary | ICD-10-CM | POA: Insufficient documentation

## 2020-11-27 DIAGNOSIS — H6123 Impacted cerumen, bilateral: Secondary | ICD-10-CM | POA: Diagnosis not present

## 2020-11-27 DIAGNOSIS — R609 Edema, unspecified: Secondary | ICD-10-CM | POA: Insufficient documentation

## 2020-11-27 DIAGNOSIS — M25569 Pain in unspecified knee: Secondary | ICD-10-CM

## 2020-11-27 LAB — COMPREHENSIVE METABOLIC PANEL
ALT: 24 U/L (ref 0–44)
AST: 16 U/L (ref 15–41)
Albumin: 3.5 g/dL (ref 3.5–5.0)
Alkaline Phosphatase: 46 U/L (ref 38–126)
Anion gap: 6 (ref 5–15)
BUN: 15 mg/dL (ref 6–20)
CO2: 27 mmol/L (ref 22–32)
Calcium: 8.8 mg/dL — ABNORMAL LOW (ref 8.9–10.3)
Chloride: 105 mmol/L (ref 98–111)
Creatinine, Ser: 1.09 mg/dL (ref 0.61–1.24)
GFR, Estimated: >60 mL/min (ref >60)
Glucose, Bld: 115 mg/dL — ABNORMAL HIGH (ref 70–99)
Potassium: 4 mmol/L (ref 3.5–5.1)
Sodium: 138 mmol/L (ref 135–145)
Total Bilirubin: 0.6 mg/dL (ref 0.3–1.2)
Total Protein: 6.3 g/dL — ABNORMAL LOW (ref 6.5–8.1)

## 2020-11-27 LAB — CBC WITH DIFFERENTIAL/PLATELET
Abs Immature Granulocytes: 0.03 10*3/uL (ref 0.00–0.07)
Basophils Absolute: 0.1 10*3/uL (ref 0.0–0.1)
Basophils Relative: 1 %
Eosinophils Absolute: 0.4 10*3/uL (ref 0.0–0.5)
Eosinophils Relative: 4 %
HCT: 38.7 % — ABNORMAL LOW (ref 39.0–52.0)
Hemoglobin: 12.1 g/dL — ABNORMAL LOW (ref 13.0–17.0)
Immature Granulocytes: 0 %
Lymphocytes Relative: 28 %
Lymphs Abs: 2.6 10*3/uL (ref 0.7–4.0)
MCH: 26.7 pg (ref 26.0–34.0)
MCHC: 31.3 g/dL (ref 30.0–36.0)
MCV: 85.2 fL (ref 80.0–100.0)
Monocytes Absolute: 0.6 10*3/uL (ref 0.1–1.0)
Monocytes Relative: 7 %
Neutro Abs: 5.6 10*3/uL (ref 1.7–7.7)
Neutrophils Relative %: 60 %
Platelets: 192 10*3/uL (ref 150–400)
RBC: 4.54 MIL/uL (ref 4.22–5.81)
RDW: 14.3 % (ref 11.5–15.5)
WBC: 9.3 10*3/uL (ref 4.0–10.5)
nRBC: 0 % (ref 0.0–0.2)

## 2020-11-27 LAB — TROPONIN I (HIGH SENSITIVITY)
Troponin I (High Sensitivity): 4 ng/L (ref <18)
Troponin I (High Sensitivity): <2 ng/L (ref <18)

## 2020-11-27 LAB — BRAIN NATRIURETIC PEPTIDE: B Natriuretic Peptide: 33.8 pg/mL (ref 0.0–100.0)

## 2020-11-27 MED ORDER — ALBUTEROL SULFATE HFA 108 (90 BASE) MCG/ACT IN AERS: 1.0000 | INHALATION_SPRAY | Freq: Four times a day (QID) | RESPIRATORY_TRACT | 0 refills | Status: AC | PRN

## 2020-11-27 MED ORDER — DOXYCYCLINE HYCLATE 100 MG PO CAPS: 100.0000 mg | ORAL_CAPSULE | Freq: Two times a day (BID) | ORAL | 0 refills | Status: DC

## 2020-11-27 MED ORDER — DOCUSATE SODIUM 50 MG/5ML PO LIQD
50.0000 mg | Freq: Once | ORAL | Status: AC
  Administered 2020-11-27: 50 mg via ORAL
  Filled 2020-11-27: qty 10

## 2020-11-27 NOTE — Discharge Instructions (Addendum)
You can buy over the counter Debrox to help with your ear wax build up.  Your ultrasound did not show any signs of a blood clot.  Prescription sent to the pharmacy for doxycycline.  This is to help with a possible skin infection.  Take as prescribed.  Follow-up with your regular doctor to talk about your leg swelling.

## 2020-11-27 NOTE — ED Notes (Signed)
Pt is complaining of increased hearing loss over the past month in both ears

## 2020-11-27 NOTE — ED Triage Notes (Signed)
Today states that although it's been going on a while today states now having a hard time walking

## 2020-11-27 NOTE — ED Provider Notes (Signed)
[facility] Provider Note   CSN: [id number] Arrival date & time: 11/27/20  [id number]     History Chief Complaint  Patient presents with  . Leg Swelling  . Hearing Problem    Drake LeachLionel Loyal is a 35 y.o. male with past medical history significant for chronic back pain, gastritis, hiatal hernia and ulcer, tobacco abuse.  HPI Patient presents to emergency room today with chief complaint of bilateral lower extremity edema that been going on x1 month.  Patient states he noticed that his leg swelling has worsened over the last week and yesterday he had severe pain in his left knee and had difficulty walking because of it.  He does admit to injuring his right calf x1 month ago.  He states he was working on a car when he bumped his right shin and noticed a scratch.  He thinks it has been healing well however does note he has some skin darkening on bilateral ankles.  Patient is also endorsing shortness of breath that is worse with ambulation and pleuritic chest pain. Patient reports hearing loss x1 week.  He states first he had decreased hearing in his left and now it is in both ears.  Patient admits to cleaning his ears daily after showering.  He denies any head trauma or tinnitus. Admits to smoking marijuana, denies any other drug use.  He denies any family history of heart disease.   Past Medical History:  Diagnosis Date  . Back pain, chronic   . Gastritis   . Hiatal hernia   . Ulcer     Patient Active Problem List   Diagnosis Date Noted  . Chronic hand pain 01/18/2013  . GERD (gastroesophageal reflux disease) 01/18/2013  . Marijuana smoker 01/18/2013  . Unspecified gastritis and gastroduodenitis without mention of hemorrhage 01/12/2013  . Erosive esophagitis 01/12/2013  . Hiatal hernia 01/12/2013  . Coffee ground emesis 01/11/2013  . Constipation 01/10/2013  . Abdominal pain 01/10/2013  . Chronic pain 01/10/2013    Past Surgical History:   Procedure Laterality Date  . ESOPHAGEAL MANOMETRY N/A 01/08/2017   Procedure: ESOPHAGEAL MANOMETRY (EM);  Surgeon: Graylin ShiverGanem, Salem F, MD;  Location: [facility];  Service: Endoscopy;  Laterality: N/A;  . ESOPHAGOGASTRODUODENOSCOPY N/A 01/12/2013   Procedure: ESOPHAGOGASTRODUODENOSCOPY (EGD);  Surgeon: Louis Meckelobert D Kaplan, MD;  [facility];  Service: Endoscopy;  Laterality: N/A;  . HERNIA REPAIR         No family history on file.  Social History   Tobacco Use  . Smoking status: Current Every Day Smoker    Packs/day: 0.50  . Smokeless tobacco: Never Used  Substance Use Topics  . Alcohol use: No  . Drug use: Yes    Types: Marijuana    Home Medications Prior to Admission medications   Medication Sig Start Date End Date Taking? Authorizing Provider  albuterol (VENTOLIN HFA) 108 (90 Base) MCG/ACT inhaler Inhale 1-2 puffs into the lungs every 6 (six) hours as needed for wheezing or shortness of breath. 11/27/20  Yes Walisiewicz, Lola Lofaro E, PA-C  doxycycline (VIBRAMYCIN) 100 MG capsule Take 1 capsule (100 mg total) by mouth 2 (two) times daily. 11/27/20  Yes Walisiewicz, Geanette Buonocore E, PA-C  methadone (DOLOPHINE) 10 MG/ML solution Take 160 mg by mouth every morning.   Yes [provider]  famotidine (PEPCID AC) 10 MG tablet One po bid x 3 days and then prn Patient not taking: No sig reported 07/16/13   Schinlever, Santina Evansatherine, PA-C  omeprazole (PRILOSEC) 20  MG capsule Take 1 capsule (20 mg total) by mouth daily. Patient not taking: No sig reported 07/16/13   Schinlever, Santina Evans, PA-C  omeprazole (PRILOSEC) 40 MG capsule Take 1 capsule (40 mg total) by mouth 2 (two) times daily before a meal. Patient not taking: No sig reported 03/29/18   Horton, Mayer Masker, MD  ondansetron (ZOFRAN ODT) 4 MG disintegrating tablet Take 1 tablet (4 mg total) by mouth every 8 (eight) hours as needed for nausea or vomiting. Patient not taking: No sig reported 05/31/15   Horton, Mayer Masker, MD  ondansetron  (ZOFRAN ODT) 4 MG disintegrating tablet Take 1 tablet (4 mg total) by mouth every 8 (eight) hours as needed for nausea or vomiting. Patient not taking: No sig reported 03/29/18   Horton, Mayer Masker, MD  promethazine (PHENERGAN) 25 MG tablet Take 1 tablet (25 mg total) by mouth every 6 (six) hours as needed for nausea or vomiting. Patient not taking: No sig reported 07/16/13   Schinlever, Santina Evans, PA-C  sucralfate (CARAFATE) 1 G tablet Take 1 tablet (1 g total) by mouth 4 (four) times daily -  with meals and at bedtime. Patient not taking: No sig reported 05/31/15   Horton, Mayer Masker, MD    Allergies    Other  Review of Systems   Review of Systems All other systems are reviewed and are negative for acute change except as noted in the HPI.  Physical Exam Updated Vital Signs BP 135/81 (BP Location: Right Arm)   Pulse 87   Temp 98.9 F (37.2 C)   Resp 17   Ht 6' (1.829 m)   Wt 113.4 kg   SpO2 98%   BMI 33.91 kg/m   Physical Exam Vitals and nursing note reviewed.  Constitutional:      General: He is not in acute distress.    Appearance: He is obese. He is not ill-appearing.  HENT:     Head: Normocephalic and atraumatic.     Right Ear: Tympanic membrane and external ear normal. No mastoid tenderness.     Left Ear: Tympanic membrane and external ear normal. No mastoid tenderness.     Ears:     Comments: Bilateral cerumen impaction.  Unable to visualize TMs.    Nose: Nose normal.     Mouth/Throat:     Mouth: Mucous membranes are moist.     Pharynx: Oropharynx is clear.  Eyes:     General: No scleral icterus.       Right eye: No discharge.        Left eye: No discharge.     Extraocular Movements: Extraocular movements intact.     Conjunctiva/sclera: Conjunctivae normal.     Pupils: Pupils are equal, round, and reactive to light.  Neck:     Vascular: No JVD.  Cardiovascular:     Rate and Rhythm: Normal rate and regular rhythm.     Pulses: Normal pulses.          Radial  pulses are 2+ on the right side and 2+ on the left side.       Dorsalis pedis pulses are 2+ on the right side and 2+ on the left side.     Heart sounds: Normal heart sounds.  Pulmonary:     Comments: Lungs clear to auscultation in all fields. Symmetric chest rise. No wheezing, rales, or rhonchi.  Oxygen saturation is 98% on room air. Chest:     Chest wall: No tenderness.  Abdominal:  Comments: Abdomen is soft, non-distended, and non-tender in all quadrants. No rigidity, no guarding. No peritoneal signs.  Musculoskeletal:        General: Normal range of motion.     Cervical back: Normal range of motion.     Right lower leg: Edema present.     Left lower leg: Edema present.     Comments: Bilateral lower extremity edema.  Tenderness to bilateral calves.  Compartments are tight in bilateral lower extremities.  Pain with range of motion of left knee.  No obvious deformity.  See media below   Skin:    General: Skin is warm and dry.     Capillary Refill: Capillary refill takes less than 2 seconds.     Comments: Equal tactile temperature in all extremities.  Neurological:     Mental Status: He is oriented to person, place, and time.     GCS: GCS eye subscore is 4. GCS verbal subscore is 5. GCS motor subscore is 6.     Comments: Fluent speech, no facial droop.  Psychiatric:        Behavior: Behavior normal.       ED Results / Procedures / Treatments   Labs (all labs ordered are listed, but only abnormal results are displayed) Labs Reviewed  COMPREHENSIVE METABOLIC PANEL - Abnormal; Notable for the following components:      Result Value   Glucose, Bld 115 (*)    Calcium 8.8 (*)    Total Protein 6.3 (*)    All other components within normal limits  CBC WITH DIFFERENTIAL/PLATELET - Abnormal; Notable for the following components:   Hemoglobin 12.1 (*)    HCT 38.7 (*)    All other components within normal limits  BRAIN NATRIURETIC PEPTIDE  RAPID URINE DRUG SCREEN, HOSP  PERFORMED  URINALYSIS, ROUTINE W REFLEX MICROSCOPIC  TROPONIN I (HIGH SENSITIVITY)  TROPONIN I (HIGH SENSITIVITY)    EKG EKG Interpretation  Date/Time:  Wednesday November 27 2020 08:29:42 EDT Ventricular Rate:  71 PR Interval:  148 QRS Duration: 101 QT Interval:  430 QTC Calculation: 468 R Axis:   34 Text Interpretation: Sinus rhythm Abnormal R-wave progression, early transition Borderline T abnormalities, anterior leads Confirmed by Kristine Royal [id number]) on 11/27/2020 8:32:22 AM   Radiology DG Chest 1 View  Result Date: 11/27/2020 CLINICAL DATA:  Lower extremity swelling, knee pain EXAM: CHEST  1 VIEW COMPARISON:  12/10/2017 FINDINGS: The heart size and mediastinal contours are within normal limits. Both lungs are clear. The visualized skeletal structures are unremarkable. IMPRESSION: No active disease. Electronically Signed   By: Judie Petit.  Shick M.D.   On: 11/27/2020 08:13   DG Knee Complete 4 Views Left  Result Date: 11/27/2020 CLINICAL DATA:  Knee pain, lower extremity swelling EXAM: LEFT KNEE - COMPLETE 4+ VIEW COMPARISON:  12/10/2017 FINDINGS: No evidence of fracture, dislocation, or joint effusion. No evidence of arthropathy or other focal bone abnormality. Soft tissues are unremarkable. IMPRESSION: Negative. Electronically Signed   By: Judie Petit.  Shick M.D.   On: 11/27/2020 08:13   VAS [place] LOWER EXTREMITY VENOUS (DVT) (ONLY [facility])  Result Date: 11/27/2020  Lower Venous DVT Study Patient Name:  CARNIE BRUEMMER  Date of Exam:   11/27/2020 Medical Rec #: [id number]     Accession #:    [id number] Date of Birth: Sep 18, 1985      Patient Gender: M Patient Age:   35Y Exam Location:  [facility] Procedure:      VAS [place] LOWER EXTREMITY  VENOUS (DVT) Referring Phys: [id number] Shanon Ace --------------------------------------------------------------------------------  Indications: Swelling, and Pain. Other Indications: Left knee pain. Comparison Study: No prior Performing Technologist: Marilynne Halsted  RDMS, RVT  Examination Guidelines: A complete evaluation includes B-mode imaging, spectral Doppler, color Doppler, and power Doppler as needed of all accessible portions of each vessel. Bilateral testing is considered an integral part of a complete examination. Limited examinations for reoccurring indications may be performed as noted. The reflux portion of the exam is performed with the patient in reverse Trendelenburg.  +---------+---------------+---------+-----------+----------+--------------+ RIGHT    CompressibilityPhasicitySpontaneityPropertiesThrombus Aging +---------+---------------+---------+-----------+----------+--------------+ CFV      Full           Yes      Yes                                 +---------+---------------+---------+-----------+----------+--------------+ SFJ      Full                                                        +---------+---------------+---------+-----------+----------+--------------+ FV Prox  Full                                                        +---------+---------------+---------+-----------+----------+--------------+ FV Mid   Full                                                        +---------+---------------+---------+-----------+----------+--------------+ FV DistalFull                                                        +---------+---------------+---------+-----------+----------+--------------+ PFV      Full                                                        +---------+---------------+---------+-----------+----------+--------------+ POP      Full           Yes      Yes                                 +---------+---------------+---------+-----------+----------+--------------+ PTV      Full                                                        +---------+---------------+---------+-----------+----------+--------------+ PERO     Full                                                         +---------+---------------+---------+-----------+----------+--------------+   +---------+---------------+---------+-----------+----------+--------------+  LEFT     CompressibilityPhasicitySpontaneityPropertiesThrombus Aging +---------+---------------+---------+-----------+----------+--------------+ CFV      Full           Yes      Yes                                 +---------+---------------+---------+-----------+----------+--------------+ SFJ      Full                                                        +---------+---------------+---------+-----------+----------+--------------+ FV Prox  Full                                                        +---------+---------------+---------+-----------+----------+--------------+ FV Mid   Full                                                        +---------+---------------+---------+-----------+----------+--------------+ FV DistalFull                                                        +---------+---------------+---------+-----------+----------+--------------+ PFV      Full                                                        +---------+---------------+---------+-----------+----------+--------------+ POP      Full           Yes      Yes                                 +---------+---------------+---------+-----------+----------+--------------+ PTV      Full                                                        +---------+---------------+---------+-----------+----------+--------------+ PERO     Full                                                        +---------+---------------+---------+-----------+----------+--------------+    Summary: RIGHT: - There is no evidence of deep vein thrombosis in the lower extremity.  - No cystic structure found in the popliteal fossa. - Ultrasound characteristics of enlarged lymph nodes are noted in the groin.  LEFT: - There is no evidence of  deep vein thrombosis  in the lower extremity.  - No cystic structure found in the popliteal fossa. - Ultrasound characteristics of enlarged lymph nodes noted in the groin.  *See table(s) above for measurements and observations.    Preliminary     Procedures .Ear Cerumen Removal  Date/Time: 11/27/2020 12:42 PM Performed by: Shanon Ace, PA-C Authorized by: Shanon Ace, PA-C   Consent:    Consent obtained:  Verbal   Consent given by:  Patient   Risks discussed:  Infection, bleeding, incomplete removal, TM perforation, pain and dizziness Universal protocol:    Patient identity confirmed:  Verbally with patient Procedure details:    Location:  R ear   Procedure type: irrigation     Procedure outcomes: cerumen removed   Post-procedure details:    Inspection:  TM intact and no bleeding   Hearing quality:  Improved   Procedure completion:  Tolerated well, no immediate complications     Medications Ordered in ED Medications  docusate (COLACE) 50 MG/5ML liquid 50 mg (50 mg Oral Given 11/27/20 1019)    ED Course  I have reviewed the triage vital signs and the nursing notes.  Pertinent labs & imaging results that were available during my care of the patient were reviewed by me and considered in my medical decision making (see chart for details).    MDM Rules/Calculators/A&P                          History provided by patient with additional history obtained from chart review.   Patient presenting with chest pain and bilateral lower extremity edema.  The symptoms have been ongoing and not subacute in nature.  Patient is a poor historian.  On ED arrival he is afebrile.  He is in no acute distress.   He has bilateral cerumen impaction.  I was able to remove some on the right side.  RN was able to remove a large amount of cerumen on the left side.  Patient tolerated well.  Hearing is much improved afterwards.  On exam he has bilateral lower extremity edema that is nonpitting.  He has a small  healing wound on right shin.  No purulent drainage seen.  He has strong DP pulses bilaterally.  Chest x-ray shows no acute infectious process.  X-ray of left knee without any signs of acute fracture or dislocation.  I viewed imaging and agree with radiologist impressions.  CBC with overall unremarkable findings.  CMP without significant electrolyte derangement, no renal insufficiency.  Delta troponin is negative.  BNP is within normal range.  EKG without ischemic changes.  With his ongoing chest pain ACS and PE felt to be unlikely.  Patient without hypoxia with ambulation.  DVT studies bilaterally are negative.  Patient will need to follow-up outpatient with PCP for further valuation of his leg swelling.  No indications for hospitalization or further work-up at this time.  Discussed with patient and his fiance who are agreeable with plan of care. Will cover for possible cellulitic infection of RLE with doxycycline. Discussed HPI, physical exam and plan of care for this patient with attending Dr. Rodena Medin. The attending physician evaluated this patient as part of a shared visit and agrees with plan of care.  Strict return precautions discussed.    Portions of this note were generated with Scientist, clinical (histocompatibility and immunogenetics). Dictation errors may occur despite best attempts at proofreading.    Final Clinical Impression(s) / ED Diagnoses Final  diagnoses:  Leg swelling  Bilateral impacted cerumen    Rx / DC Orders ED Discharge Orders         Ordered    doxycycline (VIBRAMYCIN) 100 MG capsule  2 times daily        11/27/20 1206    albuterol (VENTOLIN HFA) 108 (90 Base) MCG/ACT inhaler  Every 6 hours PRN        11/27/20 1207           Shanon Ace, PA-C 11/27/20 1249    Wynetta Fines, MD 11/27/20 1845

## 2020-11-27 NOTE — ED Triage Notes (Signed)
About a month ago started with BLE edema and and left knee pain as well loss of hearing that started last week

## 2020-11-27 NOTE — Progress Notes (Signed)
Venous lower ext  has been completed. Refer to [facility] under chart review to view preliminary results.   11/27/2020  9:36 AM Tangie Stay, Gerarda Gunther

## 2020-11-27 NOTE — ED Notes (Signed)
Irrigated left ear with success a very large solid plug of wax was flushed out. Pt states he can now hear from that ear

## 2021-01-24 ENCOUNTER — Encounter (HOSPITAL_COMMUNITY): Payer: Self-pay

## 2021-01-24 ENCOUNTER — Emergency Department (HOSPITAL_COMMUNITY)
Admission: EM | Admit: 2021-01-24 | Discharge: 2021-01-24 | Disposition: A | Discharge status: Home / Self Care | Payer: Medicaid Other | Attending: Emergency Medicine | Admitting: Emergency Medicine

## 2021-01-24 ENCOUNTER — Other Ambulatory Visit: Payer: Self-pay

## 2021-01-24 DIAGNOSIS — M7989 Other specified soft tissue disorders: Secondary | ICD-10-CM | POA: Diagnosis not present

## 2021-01-24 DIAGNOSIS — Y92410 Unspecified street and highway as the place of occurrence of the external cause: Secondary | ICD-10-CM | POA: Diagnosis not present

## 2021-01-24 DIAGNOSIS — S39012A Strain of muscle, fascia and tendon of lower back, initial encounter: Secondary | ICD-10-CM | POA: Diagnosis not present

## 2021-01-24 DIAGNOSIS — F17210 Nicotine dependence, cigarettes, uncomplicated: Secondary | ICD-10-CM | POA: Diagnosis not present

## 2021-01-24 DIAGNOSIS — S3992XA Unspecified injury of lower back, initial encounter: Secondary | ICD-10-CM | POA: Diagnosis present

## 2021-01-24 MED ORDER — LIDOCAINE 5 % EX PTCH: 1.0000 | MEDICATED_PATCH | CUTANEOUS | 0 refills | Status: DC

## 2021-01-24 MED ORDER — DICLOFENAC SODIUM 1 % EX GEL: 2.0000 g | Freq: Four times a day (QID) | CUTANEOUS | 0 refills | Status: AC

## 2021-01-24 NOTE — Discharge Instructions (Signed)
I am prescribing you Voltaren gel as well as lidocaine patches.  Please apply these to the region the back that is causing your pain.  If you develop any new or worsening symptoms please come back to the emergency department.  Below is the contact information for cardiology.  Please give them a call and schedule follow-up appointment.  It was a pleasure to meet you.
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
## 2021-02-17 ENCOUNTER — Emergency Department (HOSPITAL_BASED_OUTPATIENT_CLINIC_OR_DEPARTMENT_OTHER)
Admission: EM | Admit: 2021-02-17 | Discharge: 2021-02-17 | Disposition: A | Discharge status: Home / Self Care | Payer: Medicaid Other | Attending: Emergency Medicine | Admitting: Emergency Medicine

## 2021-02-17 ENCOUNTER — Other Ambulatory Visit: Payer: Self-pay

## 2021-02-17 ENCOUNTER — Encounter (HOSPITAL_BASED_OUTPATIENT_CLINIC_OR_DEPARTMENT_OTHER): Payer: Self-pay

## 2021-02-17 DIAGNOSIS — Z5321 Procedure and treatment not carried out due to patient leaving prior to being seen by health care provider: Secondary | ICD-10-CM | POA: Insufficient documentation

## 2021-02-17 DIAGNOSIS — R2243 Localized swelling, mass and lump, lower limb, bilateral: Secondary | ICD-10-CM | POA: Insufficient documentation

## 2021-02-17 NOTE — ED Triage Notes (Signed)
Pt arrives POV with c/o bilateral lower leg swelling.  States legs have been swelling for approximately 6 months.  States he has been to [facility] ED a couple of times for evaluation of leg swelling, comes in today wanting legs rechecked.

## 2021-04-22 ENCOUNTER — Ambulatory Visit: Payer: Medicaid Other | Admitting: Cardiovascular Disease

## 2021-05-21 ENCOUNTER — Ambulatory Visit: Payer: Medicaid Other | Admitting: Internal Medicine
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
## 2021-07-02 ENCOUNTER — Other Ambulatory Visit: Payer: Self-pay

## 2021-07-02 ENCOUNTER — Ambulatory Visit (INDEPENDENT_AMBULATORY_CARE_PROVIDER_SITE_OTHER): Payer: Medicaid Other | Admitting: Internal Medicine

## 2021-07-02 ENCOUNTER — Encounter: Payer: Self-pay | Admitting: Internal Medicine

## 2021-07-02 VITALS — BP 110/64 | HR 65 | Ht 71.0 in | Wt 275.0 lb

## 2021-07-02 DIAGNOSIS — M7989 Other specified soft tissue disorders: Secondary | ICD-10-CM | POA: Diagnosis not present

## 2021-07-02 MED ORDER — FUROSEMIDE 20 MG PO TABS: 20.0000 mg | ORAL_TABLET | Freq: Every day | ORAL | 3 refills | Status: AC
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
## 2021-07-02 NOTE — Patient Instructions (Signed)
Medication Instructions:  PLEASE START LASIX (FUROSEMIDE) 20mg  ONCE DAILY  *If you need a refill on your cardiac medications before your next appointment, please call your pharmacy*  Follow-Up: At [facility], you and your health needs are our priority.  As part of our continuing mission to provide you with exceptional heart care, we have created designated Provider Care Teams.  These Care Teams include your primary Cardiologist (physician) and Advanced Practice Providers (APPs -  Physician Assistants and Nurse Practitioners) who all work together to provide you with the care you need, when you need it.  Your next appointment:   AS NEEDED   The format for your next appointment:   In Person  Provider:   [facility], MD

## 2021-11-06 IMAGING — DX DG KNEE COMPLETE 4+V*L*
4 series · 4 of 4 positions shown · non-contrast
Comparison: 12/10/2017

CLINICAL DATA: Knee pain, lower extremity swelling

EXAM:
LEFT KNEE - COMPLETE 4+ VIEW

[knee ap]
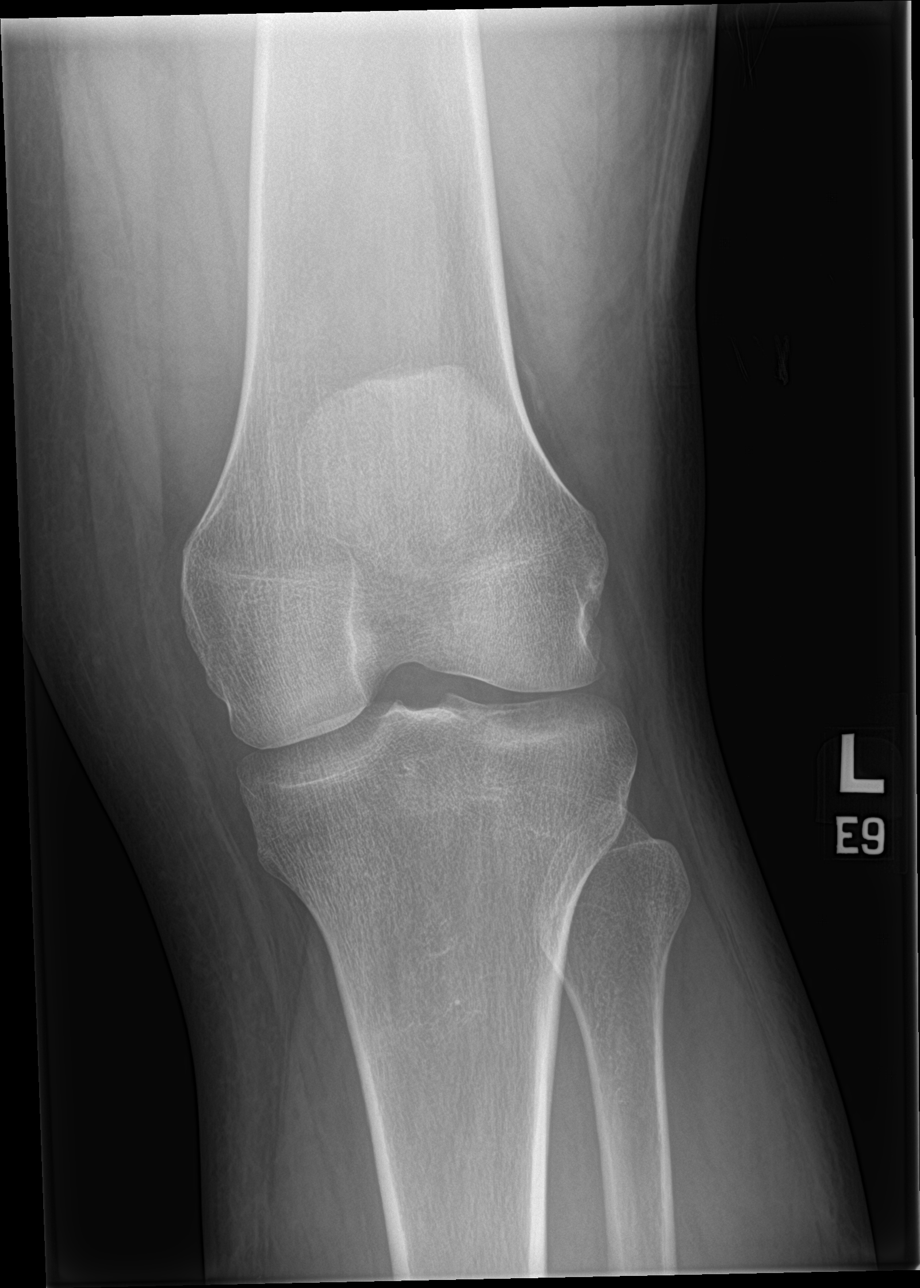

[knee lat]
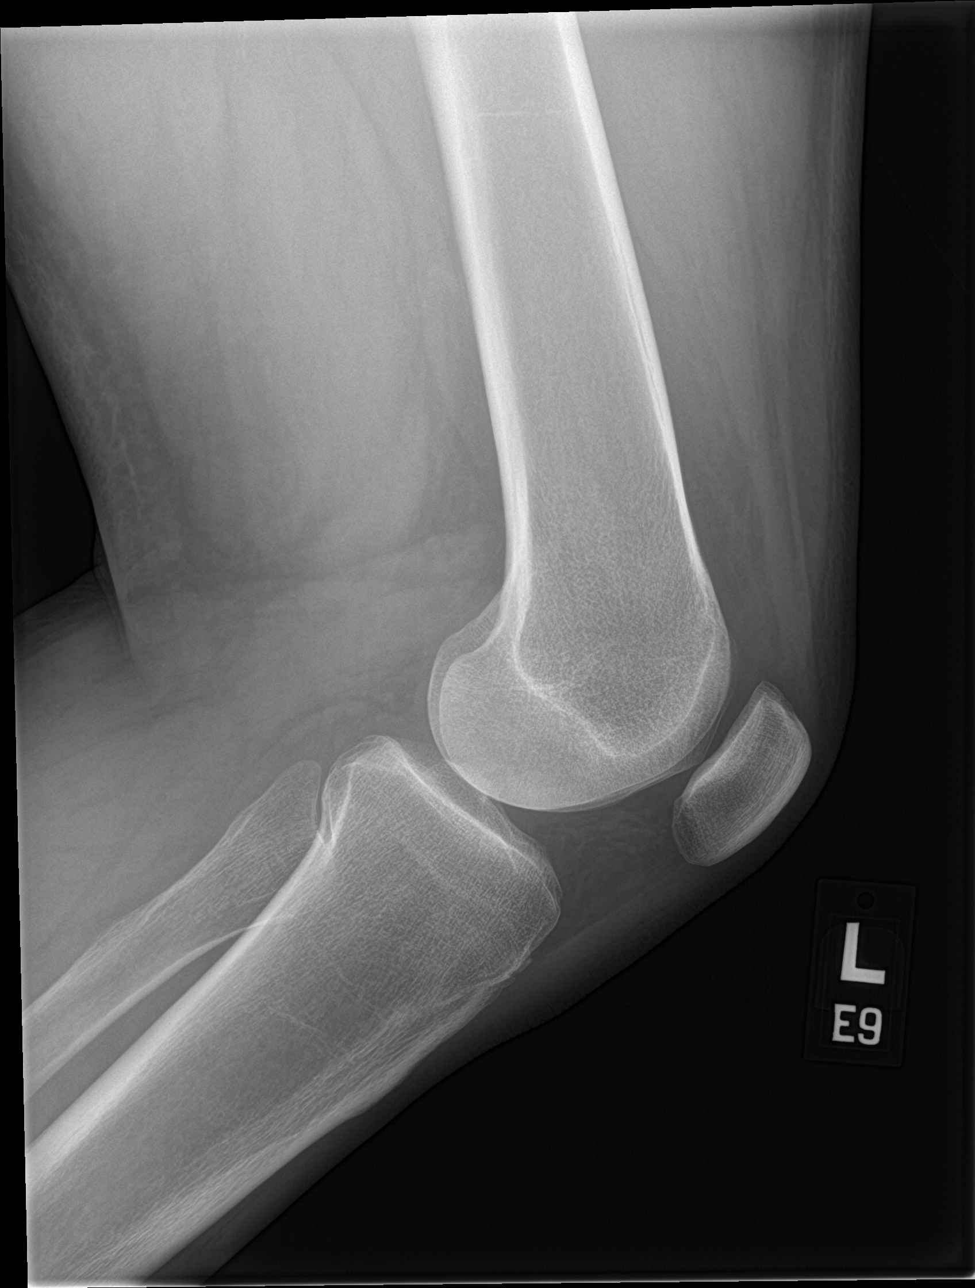

[knee obl (1 of 2)]
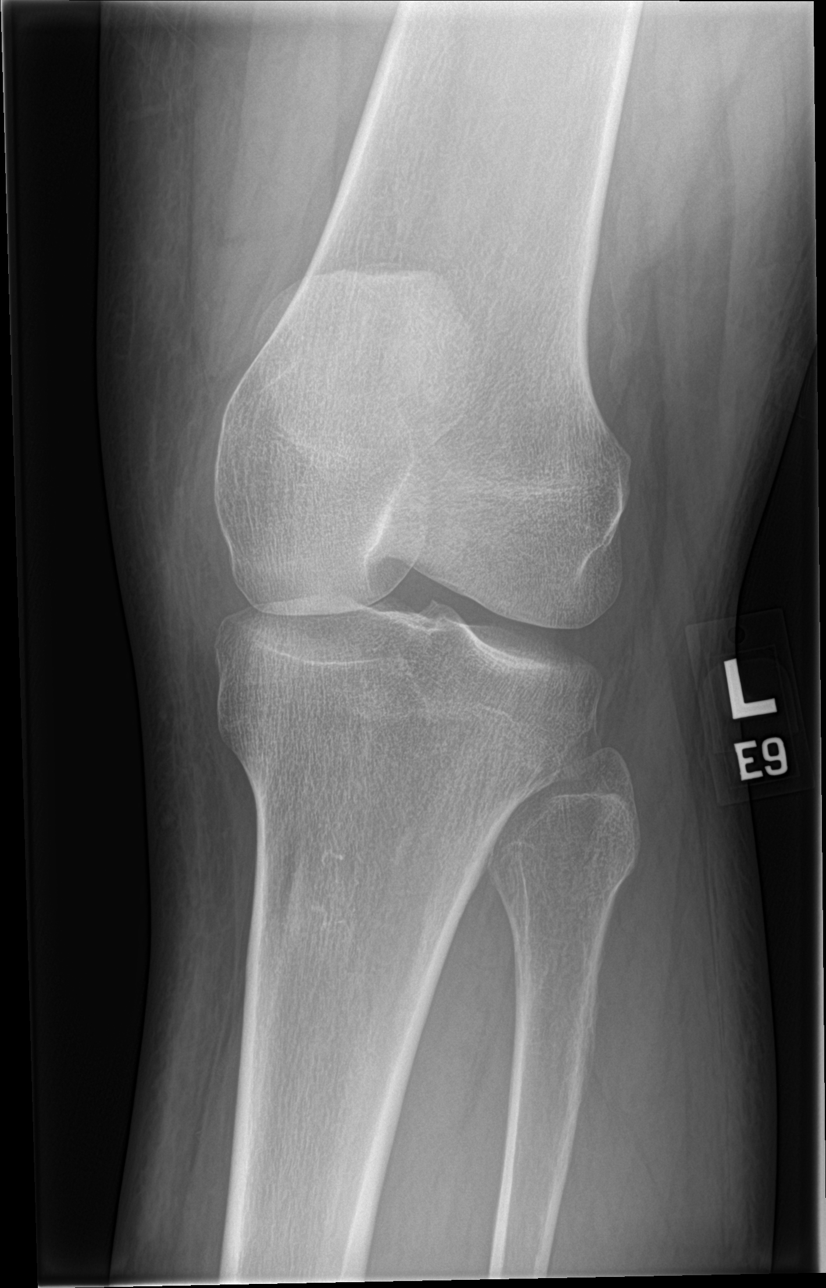

[knee obl (2 of 2)]
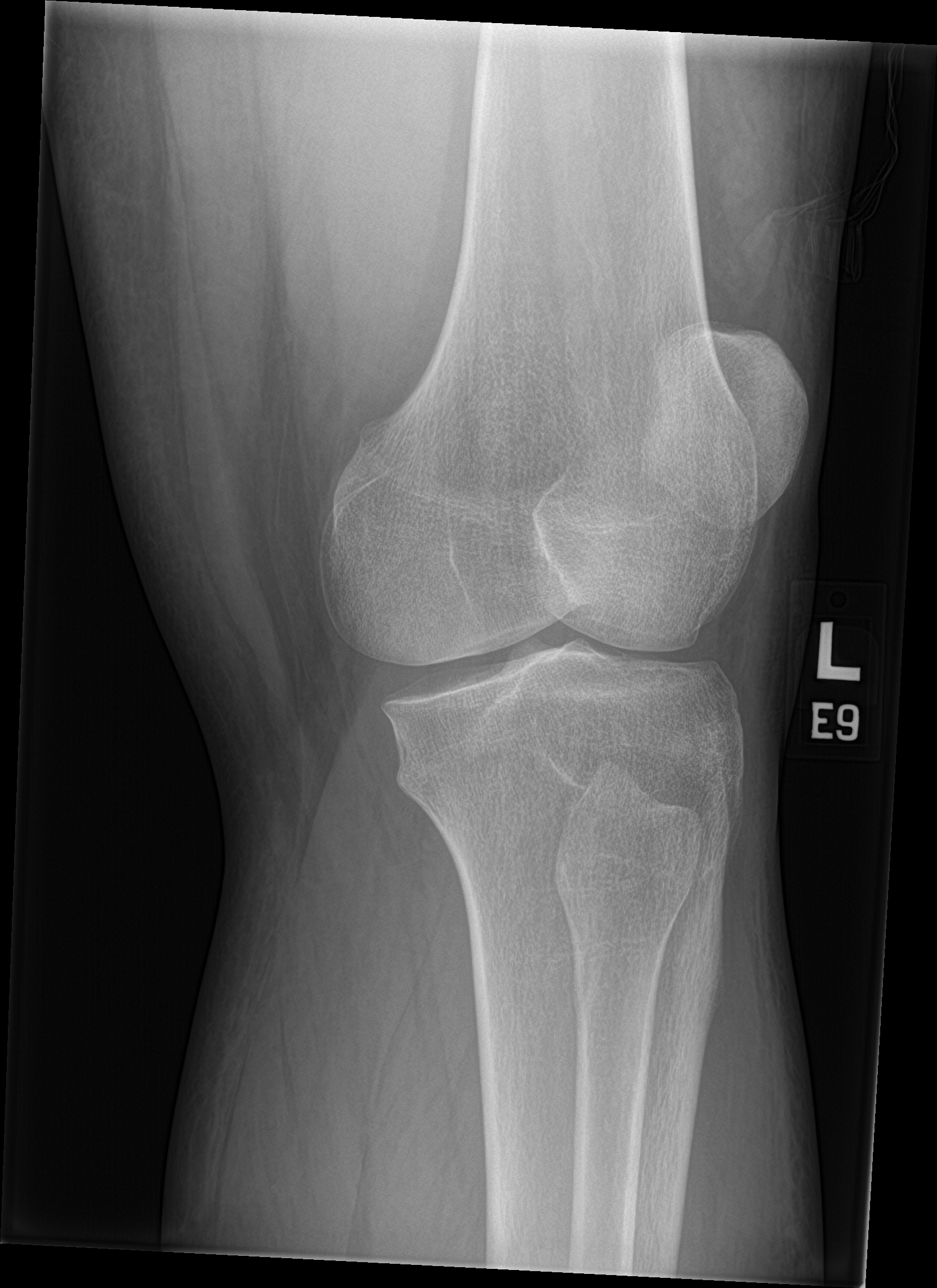

[4 of 4 positions shown; findings below may reference images not displayed]

FINDINGS: No evidence of fracture, dislocation, or joint effusion. No evidence
of arthropathy or other focal bone abnormality. Soft tissues are
unremarkable.
IMPRESSION: Negative.

## 2022-06-30 ENCOUNTER — Emergency Department (HOSPITAL_COMMUNITY): Payer: Medicaid Other

## 2022-06-30 ENCOUNTER — Encounter (HOSPITAL_COMMUNITY): Payer: Self-pay

## 2022-06-30 ENCOUNTER — Emergency Department (HOSPITAL_COMMUNITY)
Admission: EM | Admit: 2022-06-30 | Discharge: 2022-06-30 | Disposition: A | Discharge status: Home / Self Care | Payer: Medicaid Other | Attending: Emergency Medicine | Admitting: Emergency Medicine

## 2022-06-30 ENCOUNTER — Other Ambulatory Visit: Payer: Self-pay

## 2022-06-30 DIAGNOSIS — M542 Cervicalgia: Secondary | ICD-10-CM | POA: Insufficient documentation

## 2022-06-30 DIAGNOSIS — S5002XA Contusion of left elbow, initial encounter: Secondary | ICD-10-CM | POA: Diagnosis not present

## 2022-06-30 DIAGNOSIS — Y92410 Unspecified street and highway as the place of occurrence of the external cause: Secondary | ICD-10-CM | POA: Diagnosis not present

## 2022-06-30 DIAGNOSIS — S0003XA Contusion of scalp, initial encounter: Secondary | ICD-10-CM | POA: Insufficient documentation

## 2022-06-30 DIAGNOSIS — S0990XA Unspecified injury of head, initial encounter: Secondary | ICD-10-CM | POA: Diagnosis present

## 2022-06-30 DIAGNOSIS — R072 Precordial pain: Secondary | ICD-10-CM | POA: Diagnosis not present
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
## 2022-06-30 NOTE — Discharge Instructions (Addendum)
Your x-ray was reassuring.  Take Tylenol Motrin for pain.  Return to the ED if you are unable to walk, have blood in your urine, severe abdominal pain, chest pain or lateralized weakness or numbness.  Otherwise follow-up with your doctor for reevaluation in the next week or so.

## 2022-08-28 ENCOUNTER — Emergency Department (HOSPITAL_COMMUNITY)
Admission: EM | Admit: 2022-08-28 | Discharge: 2022-08-28 | Disposition: A | Discharge status: Home / Self Care | Payer: Medicaid Other | Attending: Emergency Medicine | Admitting: Emergency Medicine

## 2022-08-28 ENCOUNTER — Other Ambulatory Visit: Payer: Self-pay

## 2022-08-28 ENCOUNTER — Emergency Department (HOSPITAL_COMMUNITY): Payer: Medicaid Other

## 2022-08-28 DIAGNOSIS — Q53112 Unilateral inguinal testis: Secondary | ICD-10-CM | POA: Diagnosis not present

## 2022-08-28 DIAGNOSIS — R739 Hyperglycemia, unspecified: Secondary | ICD-10-CM | POA: Diagnosis not present

## 2022-08-28 DIAGNOSIS — N50811 Right testicular pain: Secondary | ICD-10-CM | POA: Diagnosis present

## 2022-08-28 LAB — CBC WITH DIFFERENTIAL/PLATELET
Abs Immature Granulocytes: 0.03 10*3/uL (ref 0.00–0.07)
Basophils Absolute: 0.1 10*3/uL (ref 0.0–0.1)
Basophils Relative: 1 %
Eosinophils Absolute: 0.3 10*3/uL (ref 0.0–0.5)
Eosinophils Relative: 3 %
HCT: 40.4 % (ref 39.0–52.0)
Hemoglobin: 13.4 g/dL (ref 13.0–17.0)
Immature Granulocytes: 0 %
Lymphocytes Relative: 33 %
Lymphs Abs: 2.8 10*3/uL (ref 0.7–4.0)
MCH: 27.5 pg (ref 26.0–34.0)
MCHC: 33.2 g/dL (ref 30.0–36.0)
MCV: 82.8 fL (ref 80.0–100.0)
Monocytes Absolute: 0.6 10*3/uL (ref 0.1–1.0)
Monocytes Relative: 7 %
Neutro Abs: 4.9 10*3/uL (ref 1.7–7.7)
Neutrophils Relative %: 56 %
Platelets: 167 10*3/uL (ref 150–400)
RBC: 4.88 MIL/uL (ref 4.22–5.81)
RDW: 13.4 % (ref 11.5–15.5)
WBC: 8.7 10*3/uL (ref 4.0–10.5)
nRBC: 0 % (ref 0.0–0.2)

## 2022-08-28 LAB — URINALYSIS, ROUTINE W REFLEX MICROSCOPIC
Bilirubin Urine: NEGATIVE
Glucose, UA: NEGATIVE mg/dL
Hgb urine dipstick: NEGATIVE
Ketones, ur: NEGATIVE mg/dL
Leukocytes,Ua: NEGATIVE
Nitrite: NEGATIVE
Protein, ur: NEGATIVE mg/dL
Specific Gravity, Urine: 1.02 (ref 1.005–1.030)
pH: 6 (ref 5.0–8.0)

## 2022-08-28 LAB — BASIC METABOLIC PANEL
Anion gap: 13 (ref 5–15)
BUN: 12 mg/dL (ref 6–20)
CO2: 25 mmol/L (ref 22–32)
Calcium: 9.4 mg/dL (ref 8.9–10.3)
Chloride: 101 mmol/L (ref 98–111)
Creatinine, Ser: 1.07 mg/dL (ref 0.61–1.24)
GFR, Estimated: >60 mL/min (ref >60)
Glucose, Bld: 145 mg/dL — ABNORMAL HIGH (ref 70–99)
Potassium: 3.9 mmol/L (ref 3.5–5.1)
Sodium: 139 mmol/L (ref 135–145)

## 2022-08-28 MED ORDER — HYDROCODONE-ACETAMINOPHEN 5-325 MG PO TABS: 1.0000 | ORAL_TABLET | Freq: Four times a day (QID) | ORAL | 0 refills | Status: AC | PRN

## 2022-08-28 MED ORDER — ONDANSETRON HCL 4 MG/2ML IJ SOLN
4.0000 mg | Freq: Once | INTRAMUSCULAR | Status: AC
  Administered 2022-08-28: 4 mg via INTRAVENOUS
  Filled 2022-08-28: qty 2

## 2022-08-28 MED ORDER — HYDROMORPHONE HCL 1 MG/ML IJ SOLN
0.5000 mg | Freq: Once | INTRAMUSCULAR | Status: AC
  Administered 2022-08-28: 0.5 mg via INTRAVENOUS
  Filled 2022-08-28: qty 1

## 2022-08-28 NOTE — Discharge Instructions (Signed)
If your pain abruptly worsens, if you are unable to urinate, if there is any swelling to your scrotum in the next 48 hours please return to the ER

## 2022-08-28 NOTE — ED Provider Notes (Signed)
[facility] Provider Note   CSN: [id number] Arrival date & time: 08/28/22  [id number]     History  Chief Complaint  Patient presents with   Testicular Pain     Alex Farmer is a 37 y.o. male.  The history is provided by the patient.  Patient with history of chronic back pain presents with sudden onset of right testicular pain.  Patient reports he was sleeping in recliner and he woke up just prior to arrival with severe pain.  No recent trauma.  He has never had this before.    Past Medical History:  Diagnosis Date   Back pain, chronic    Gastritis    Hiatal hernia    Ulcer     Home Medications Prior to Admission medications   Medication Sig Start Date End Date Taking? Authorizing Provider  HYDROcodone-acetaminophen (NORCO/VICODIN) 5-325 MG tablet Take 1 tablet by mouth every 6 (six) hours as needed for severe pain. 08/28/22  Yes Ripley Fraise, MD  albuterol (VENTOLIN HFA) 108 (90 Base) MCG/ACT inhaler Inhale 1-2 puffs into the lungs every 6 (six) hours as needed for wheezing or shortness of breath. 11/27/20   Walisiewicz, Verline Lema E, PA-C  diclofenac Sodium (VOLTAREN) 1 % GEL Apply 2 g topically 4 (four) times daily. 01/24/21   Rayna Sexton, PA-C  furosemide (LASIX) 20 MG tablet Take 1 tablet (20 mg total) by mouth daily. 07/02/21   Janina Mayo, MD  methadone (DOLOPHINE) 10 MG/ML solution Take 160 mg by mouth every morning.    [provider]  omeprazole (PRILOSEC) 40 MG capsule Take 1 capsule (40 mg total) by mouth 2 (two) times daily before a meal. 03/29/18   Horton, Barbette Hair, MD      Allergies    Other    Review of Systems   Review of Systems  Constitutional:  Negative for fever.  Genitourinary:  Positive for testicular pain.    Physical Exam Updated Vital Signs BP (!) 160/133 (BP Location: Right Arm)   Pulse 77   Temp 97.9 F (36.6 C) (Oral)   Resp 20   SpO2 100%  Physical Exam CONSTITUTIONAL: Well  developed/well nourished, anxious and crying Patient request to have fianc at bedside during exam HEAD: Normocephalic/atraumatic EYES: EOMI ABDOMEN: soft, nontender, obese GU: Significant tenderness throughout the right scrotum.  Appears that the testicle is high riding, but exam is limited due to his severe pain No overlying erythema, no abscess NEURO: Pt is awake/alert/appropriate, moves all extremitiesx4.  No facial droop.   EXTREMITIES: full ROM SKIN: warm, color normal PSYCH: Anxious ED Results / Procedures / Treatments   Labs (all labs ordered are listed, but only abnormal results are displayed) Labs Reviewed  BASIC METABOLIC PANEL - Abnormal; Notable for the following components:      Result Value   Glucose, Bld 145 (*)    All other components within normal limits  CBC WITH DIFFERENTIAL/PLATELET  URINALYSIS, ROUTINE W REFLEX MICROSCOPIC    EKG None  Radiology US SCROTUM W/DOPPLER  Result Date: 08/28/2022 CLINICAL DATA:  Right testicular pain. EXAM: SCROTAL ULTRASOUND DOPPLER ULTRASOUND OF THE TESTICLES TECHNIQUE: Complete ultrasound examination of the testicles, epididymis, and other scrotal structures was performed. Color and spectral Doppler ultrasound were also utilized to evaluate blood flow to the testicles. COMPARISON:  None Available. FINDINGS: Right testicle Measurements: 3.1 x 1.7 x 2.3 cm. No mass or microlithiasis visualized. Testicle is located within the right inguinal canal. Left testicle Measurements:  2.8 x 1.9 x 2.4 cm. No mass or microlithiasis visualized. Right epididymis:  Not visualized due to testicular location. Left epididymis: Normal in size and appearance. Tiny left epididymal cyst or spermatocele evident. Hydrocele: Small volume simple appearing free fluid associated with the right testicle. No left hydrocele. Varicocele:  None visualized. Pulsed Doppler interrogation of both testes demonstrates normal low resistance arterial and venous waveforms  bilaterally. IMPRESSION: 1. Right testicle is located within the right inguinal canal. Clinical correlation for undescended testicle recommended. No evidence for right testicular torsion with low resistance arterial and venous flow tracings evident on spectral Doppler assessment. Small volume simple appearing free fluid associated with the right testicle, nonspecific. 2. Unremarkable left testicle. Electronically Signed   By: Misty Stanley M.D.   On: 08/28/2022 07:10    Procedures Procedures    Medications Ordered in ED Medications  HYDROmorphone (DILAUDID) injection 0.5 mg (0.5 mg Intravenous Given 08/28/22 0555)  ondansetron (ZOFRAN) injection 4 mg (4 mg Intravenous Given 08/28/22 0554)    ED Course/ Medical Decision Making/ A&P Clinical Course as of 08/28/22 0725  Fri Aug 28, 2022  0624 Glucose(!): 145 Hyperglycemia  [DW]  0724 Patient appears improved.  Ultrasound does not reveal any evidence of torsion, but he does have an undescended testicle.  Patient reports this appears to be a new phenomenon. On exam he has no other tenderness.  No abdominal tenderness.  No tenderness or erythema to the perineum.  He is up walking around he would like to be discharged.  Plan to refer him to urology.  We discussed strict return precautions [DW]    Clinical Course User Index [DW] Ripley Fraise, MD                             Medical Decision Making Amount and/or Complexity of Data Reviewed Labs: ordered. Decision-making details documented in ED Course.  Risk Prescription drug management.   This patient presents to the ED for concern of testicle pain, this involves an extensive number of treatment options, and is a complaint that carries with it a high risk of complications and morbidity.  The differential diagnosis includes but is not limited to orchitis, torsion, ureteral stone  Comorbidities that complicate the patient evaluation: Patient's presentation is complicated by their history of  chronic pain  Additional history obtained: Additional history obtained from significant other  Lab Tests: I Ordered, and personally interpreted labs.  The pertinent results include: hyperglycemia  Imaging Studies ordered: I ordered imaging studies including Ultrasound scrotal   I independently visualized and interpreted imaging which showed no evidence of torsion, undescended testicle I agree with the radiologist interpretation   Medicines ordered and prescription drug management: I ordered medication including Dilaudid for pain Reevaluation of the patient after these medicines showed that the patient    improved   Reevaluation: After the interventions noted above, I reevaluated the patient and found that they have :improved  Complexity of problems addressed: Patient's presentation is most consistent with  acute presentation with potential threat to life or bodily function  Disposition: After consideration of the diagnostic results and the patient's response to treatment,  I feel that the patent would benefit from discharge   .           Final Clinical Impression(s) / ED Diagnoses Final diagnoses:  Unilateral inguinal testis    Rx / DC Orders ED Discharge Orders  Ordered    HYDROcodone-acetaminophen (NORCO/VICODIN) 5-325 MG tablet  Every 6 hours PRN        08/28/22 [id number]              Ripley Fraise, MD 08/28/22 [phone]

## 2022-08-28 NOTE — ED Triage Notes (Signed)
Patient reports sudden onset intense right testicular pain this morning , denies injury or hematuria .

## 2023-09-05 ENCOUNTER — Emergency Department (HOSPITAL_COMMUNITY)
Admission: EM | Admit: 2023-09-05 | Discharge: 2023-09-05 | Disposition: A | Discharge status: Home / Self Care | Payer: Self-pay | Attending: Emergency Medicine | Admitting: Emergency Medicine

## 2023-09-05 ENCOUNTER — Encounter (HOSPITAL_COMMUNITY): Payer: Self-pay

## 2023-09-05 ENCOUNTER — Other Ambulatory Visit: Payer: Self-pay

## 2023-09-05 ENCOUNTER — Emergency Department (HOSPITAL_COMMUNITY): Payer: Self-pay

## 2023-09-05 DIAGNOSIS — R109 Unspecified abdominal pain: Secondary | ICD-10-CM | POA: Insufficient documentation

## 2023-09-05 DIAGNOSIS — M545 Low back pain, unspecified: Secondary | ICD-10-CM | POA: Insufficient documentation

## 2023-09-05 LAB — URINALYSIS, ROUTINE W REFLEX MICROSCOPIC
Bilirubin Urine: NEGATIVE
Glucose, UA: NEGATIVE mg/dL
Hgb urine dipstick: NEGATIVE
Ketones, ur: NEGATIVE mg/dL
Leukocytes,Ua: NEGATIVE
Nitrite: NEGATIVE
Protein, ur: NEGATIVE mg/dL
Specific Gravity, Urine: 1.021 (ref 1.005–1.030)
pH: 6 (ref 5.0–8.0)

## 2023-09-05 MED ORDER — DIAZEPAM 5 MG PO TABS
5.0000 mg | ORAL_TABLET | Freq: Once | ORAL | Status: AC
  Administered 2023-09-05: 5 mg via ORAL
  Filled 2023-09-05: qty 1

## 2023-09-05 MED ORDER — METHOCARBAMOL 500 MG PO TABS: 500.0000 mg | ORAL_TABLET | Freq: Two times a day (BID) | ORAL | 0 refills | Status: AC

## 2023-09-05 MED ORDER — ACETAMINOPHEN 500 MG PO TABS
1000.0000 mg | ORAL_TABLET | Freq: Once | ORAL | Status: AC
  Administered 2023-09-05: 1000 mg via ORAL
  Filled 2023-09-05: qty 2

## 2023-09-05 MED ORDER — OXYCODONE HCL 5 MG PO TABS
5.0000 mg | ORAL_TABLET | Freq: Once | ORAL | Status: AC
  Administered 2023-09-05: 5 mg via ORAL
  Filled 2023-09-05: qty 1

## 2023-09-05 MED ORDER — KETOROLAC TROMETHAMINE 15 MG/ML IJ SOLN
15.0000 mg | Freq: Once | INTRAMUSCULAR | Status: AC
  Administered 2023-09-05: 15 mg via INTRAMUSCULAR
  Filled 2023-09-05: qty 1

## 2023-09-05 NOTE — ED Triage Notes (Signed)
 Right sided back pain that started a month ago. No issues urinating. Pt states he had xray of chest and back 2 days ago and it was abnormal in the chest, pt has not followed up.

## 2023-09-05 NOTE — Discharge Instructions (Addendum)
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
## 2023-12-21 ENCOUNTER — Encounter (HOSPITAL_COMMUNITY): Payer: Self-pay

## 2023-12-21 ENCOUNTER — Ambulatory Visit (HOSPITAL_COMMUNITY)
Admission: EM | Admit: 2023-12-21 | Discharge: 2023-12-21 | Discharge status: Against Medical Advice | Payer: MEDICAID | Attending: Emergency Medicine | Admitting: Emergency Medicine

## 2023-12-21 DIAGNOSIS — W540XXA Bitten by dog, initial encounter: Secondary | ICD-10-CM | POA: Diagnosis not present

## 2023-12-21 DIAGNOSIS — S61412A Laceration without foreign body of left hand, initial encounter: Secondary | ICD-10-CM | POA: Diagnosis not present

## 2023-12-21 NOTE — ED Provider Notes (Signed)
 [facility]    CSN: [id number] Arrival date & time: 12/21/23  1236      History   Chief Complaint Chief Complaint  Patient presents with   Animal Bite    HPI Alex Farmer is a 38 y.o. male.   Alex Farmer, 38 year old male pt, presents to urgent care for evaluation of animal bite to the left hand.  Patient states incident happened yesterday at home, he has been taking amoxicillin and ibuprofen for recent dental infection. Pt is left hand dominant. Pt states his dog is a muttdon't know breed, just a puppy, reports dogs shots are UTD his last tetanus was less than 5 years.   The history is provided by the patient. No language interpreter was used.    Past Medical History:  Diagnosis Date   Back pain, chronic    Gastritis    Hiatal hernia    Ulcer     Patient Active Problem List   Diagnosis Date Noted   Chronic hand pain 01/18/2013   GERD (gastroesophageal reflux disease) 01/18/2013   Marijuana smoker 01/18/2013   Gastritis and gastroduodenitis 01/12/2013   Erosive esophagitis 01/12/2013   Hiatal hernia 01/12/2013   Coffee ground emesis 01/11/2013   Constipation 01/10/2013   Abdominal pain 01/10/2013   Chronic pain 01/10/2013    Past Surgical History:  Procedure Laterality Date   ESOPHAGEAL MANOMETRY N/A 01/08/2017   Procedure: ESOPHAGEAL MANOMETRY (EM);  Surgeon: Lennard Lesta FALCON, MD;  Location: [facility];  Service: Endoscopy;  Laterality: N/A;   ESOPHAGOGASTRODUODENOSCOPY N/A 01/12/2013   Procedure: ESOPHAGOGASTRODUODENOSCOPY (EGD);  Surgeon: Lamar JONETTA Aho, MD;  Location: [facility] ENDOSCOPY;  Service: Endoscopy;  Laterality: N/A;   HERNIA REPAIR         Home Medications    Prior to Admission medications   Medication Sig Start Date End Date Taking? Authorizing Provider  albuterol  (VENTOLIN  HFA) 108 (90 Base) MCG/ACT inhaler Inhale 1-2 puffs into the lungs every 6 (six) hours as needed for wheezing or shortness of breath. 11/27/20   Walisiewicz,  Kaitlyn E, PA-C  diclofenac  Sodium (VOLTAREN ) 1 % GEL Apply 2 g topically 4 (four) times daily. 01/24/21   Joldersma, Logan, PA-C  furosemide  (LASIX ) 20 MG tablet Take 1 tablet (20 mg total) by mouth daily. 07/02/21   Alvan Ronal BRAVO, MD  HYDROcodone -acetaminophen  (NORCO/VICODIN) 5-325 MG tablet Take 1 tablet by mouth every 6 (six) hours as needed for severe pain. 08/28/22   Midge Golas, MD  methadone (DOLOPHINE) 10 MG/ML solution Take 160 mg by mouth every morning.    [provider]  methocarbamol  (ROBAXIN ) 500 MG tablet Take 1 tablet (500 mg total) by mouth 2 (two) times daily. 09/05/23   Emil Share, DO  omeprazole  (PRILOSEC) 40 MG capsule Take 1 capsule (40 mg total) by mouth 2 (two) times daily before a meal. 03/29/18   Horton, Charmaine FALCON, MD    Family History Family History  Problem Relation Age of Onset   Hypertension Mother    Diabetes Father    Hypertension Father     Social History Social History   Tobacco Use   Smoking status: Every Day    Current packs/day: 0.50    Types: Cigarettes   Smokeless tobacco: Never  Vaping Use   Vaping status: Never Used  Substance Use Topics   Alcohol use: No   Drug use: Yes    Types: Marijuana    Comment: daily use     Allergies   Other  Review of Systems Review of Systems  Skin:  Positive for wound.  All other systems reviewed and are negative.    Physical Exam Triage Vital Signs ED Triage Vitals [12/21/23 1329]  Encounter Vitals Group     BP      Girls Systolic BP Percentile      Girls Diastolic BP Percentile      Boys Systolic BP Percentile      Boys Diastolic BP Percentile      Pulse      Resp      Temp      Temp src      SpO2      Weight      Height      Head Circumference      Peak Flow      Pain Score 8     Pain Loc      Pain Education      Exclude from Growth Chart    No data found.  Updated Vital Signs BP 127/81 (BP Location: Left Arm)   Pulse 81   Temp 98.3 F (36.8 C) (Oral)   Resp  16   SpO2 94%   Visual Acuity Right Eye Distance:   Left Eye Distance:   Bilateral Distance:    Right Eye Near:   Left Eye Near:    Bilateral Near:     Physical Exam Vitals and nursing note reviewed.   Skin:    General: Skin is warm.     Findings: Signs of injury and wound present.     Comments: Large gaping wound ~7 cm to dorsal aspect left hand between 4th and 5 metacarpal to wrist, + swelling, decreased ROM   Neurological:     Mental Status: He is alert and oriented to person, place, and time.     Comments: Unable to complete exam as pt left in middle of exam and treatment  Psychiatric:        Mood and Affect: Affect is labile and angry.        Behavior: Behavior is agitated.     Comments: Pt became upset when advised need to take picture of wound for chart and staff asked pt to complete animal control form, pt refused and removed hand form Hibiclens soak and walked out with open wound      UC Treatments / Results  Labs (all labs ordered are listed, but only abnormal results are displayed) Labs Reviewed - No data to display  EKG   Radiology No results found.  Procedures Procedures (including critical care time)  Medications Ordered in UC Medications - No data to display  Initial Impression / Assessment and Plan / UC Course  I have reviewed the triage vital signs and the nursing notes.  Pertinent labs & imaging results that were available during my care of the patient were reviewed by me and considered in my medical decision making (see chart for details).  Clinical Course as of 12/21/23 1530  Tue Dec 21, 2023  1346 Pt eloped, pt became upset when inquired of incident location, animal control form and wound evaluation, states I'm leaving y'all trying to take my dog [JD]    Clinical Course User Index [JD] Pattye Meda, Rilla, NP    Ddx: Dog bite, old hand laceration, tenosynovitis,left hand injury Final Clinical Impressions(s) / UC Diagnoses   Final  diagnoses:  None   Discharge Instructions   None    ED Prescriptions   None    PDMP  not reviewed this encounter.   Aminta Loose, NP 12/21/23 1530

## 2023-12-21 NOTE — ED Notes (Signed)
 Pt advised he would need to complete the [place] animal control bite form. Pt states he wasn't completing the form. He then removed his hand from soap and water and walked out of exam room. States that he isnt getting his dogs took away from him.
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
